# Patient Record
Sex: Male | Born: 1970
Health system: Southern US, Community
[De-identification: ages and names within clinical notes are randomized; demographics above are authoritative.]

## PROBLEM LIST (undated history)

## (undated) DIAGNOSIS — M109 Gout, unspecified: Secondary | ICD-10-CM

## (undated) DIAGNOSIS — I10 Essential (primary) hypertension: Secondary | ICD-10-CM

## (undated) HISTORY — PX: WISDOM TOOTH EXTRACTION: SHX21

## (undated) HISTORY — PX: OTHER SURGICAL HISTORY: SHX169

---

## 2015-02-24 ENCOUNTER — Encounter: Payer: Self-pay | Admitting: *Deleted

## 2015-02-24 ENCOUNTER — Ambulatory Visit
Admission: EM | Admit: 2015-02-24 | Discharge: 2015-02-24 | Disposition: A | Discharge status: Home / Self Care | Payer: BLUE CROSS/BLUE SHIELD | Attending: Emergency Medicine | Admitting: Emergency Medicine

## 2015-02-24 DIAGNOSIS — R109 Unspecified abdominal pain: Secondary | ICD-10-CM | POA: Diagnosis present

## 2015-02-24 DIAGNOSIS — N23 Unspecified renal colic: Secondary | ICD-10-CM | POA: Insufficient documentation

## 2015-02-24 DIAGNOSIS — R319 Hematuria, unspecified: Secondary | ICD-10-CM | POA: Diagnosis not present

## 2015-02-24 DIAGNOSIS — R944 Abnormal results of kidney function studies: Secondary | ICD-10-CM | POA: Diagnosis not present

## 2015-02-24 DIAGNOSIS — R7989 Other specified abnormal findings of blood chemistry: Secondary | ICD-10-CM

## 2015-02-24 HISTORY — DX: Essential (primary) hypertension: I10

## 2015-02-24 LAB — COMPREHENSIVE METABOLIC PANEL
ALT: 27 U/L (ref 17–63)
AST: 25 U/L (ref 15–41)
Albumin: 4.8 g/dL (ref 3.5–5.0)
Alkaline Phosphatase: 75 U/L (ref 38–126)
Anion gap: 10 (ref 5–15)
BUN: 15 mg/dL (ref 6–20)
CO2: 26 mmol/L (ref 22–32)
Calcium: 9.4 mg/dL (ref 8.9–10.3)
Chloride: 105 mmol/L (ref 101–111)
Creatinine, Ser: 1.47 mg/dL — ABNORMAL HIGH (ref 0.61–1.24)
GFR calc Af Amer: >60 mL/min (ref >60)
GFR calc non Af Amer: 57 mL/min — ABNORMAL LOW (ref >60)
Glucose, Bld: 125 mg/dL — ABNORMAL HIGH (ref 65–99)
Potassium: 3.9 mmol/L (ref 3.5–5.1)
Sodium: 141 mmol/L (ref 135–145)
Total Bilirubin: 0.7 mg/dL (ref 0.3–1.2)
Total Protein: 8.2 g/dL — ABNORMAL HIGH (ref 6.5–8.1)

## 2015-02-24 LAB — CBC WITH DIFFERENTIAL/PLATELET
Basophils Absolute: 0 10*3/uL (ref 0–0.1)
Basophils Relative: 0 %
Eosinophils Absolute: 0 10*3/uL (ref 0–0.7)
Eosinophils Relative: 0 %
HCT: 45.4 % (ref 40.0–52.0)
Hemoglobin: 15.2 g/dL (ref 13.0–18.0)
Lymphocytes Relative: 6 %
Lymphs Abs: 0.9 10*3/uL — ABNORMAL LOW (ref 1.0–3.6)
MCH: 29.2 pg (ref 26.0–34.0)
MCHC: 33.5 g/dL (ref 32.0–36.0)
MCV: 87.1 fL (ref 80.0–100.0)
Monocytes Absolute: 0.7 10*3/uL (ref 0.2–1.0)
Monocytes Relative: 4 %
Neutro Abs: 13.3 10*3/uL — ABNORMAL HIGH (ref 1.4–6.5)
Neutrophils Relative %: 90 %
Platelets: 270 10*3/uL (ref 150–440)
RBC: 5.21 MIL/uL (ref 4.40–5.90)
RDW: 12.5 % (ref 11.5–14.5)
WBC: 14.9 10*3/uL — ABNORMAL HIGH (ref 3.8–10.6)

## 2015-02-24 LAB — URINALYSIS COMPLETE WITH MICROSCOPIC (ARMC ONLY)
Bacteria, UA: NONE SEEN — AB
Glucose, UA: NEGATIVE mg/dL
Ketones, ur: NEGATIVE mg/dL
Leukocytes, UA: NEGATIVE
Nitrite: NEGATIVE
Protein, ur: 30 mg/dL — AB
Specific Gravity, Urine: 1.03 (ref 1.005–1.030)
Squamous Epithelial / LPF: NONE SEEN — AB
WBC, UA: NONE SEEN WBC/hpf (ref <3)
pH: 5 (ref 5.0–8.0)

## 2015-02-24 MED ORDER — IBUPROFEN 800 MG PO TABS: 800.0000 mg | ORAL_TABLET | Freq: Three times a day (TID) | ORAL | Status: DC

## 2015-02-24 MED ORDER — SODIUM CHLORIDE 0.9 % IV BOLUS (SEPSIS)
1000.0000 mL | Freq: Once | INTRAVENOUS | Status: AC
  Administered 2015-02-24: 1000 mL via INTRAVENOUS

## 2015-02-24 MED ORDER — ONDANSETRON HCL 4 MG PO TABS: 4.0000 mg | ORAL_TABLET | Freq: Four times a day (QID) | ORAL | Status: DC

## 2015-02-24 MED ORDER — ONDANSETRON 8 MG PO TBDP
8.0000 mg | ORAL_TABLET | Freq: Once | ORAL | Status: AC
  Administered 2015-02-24: 8 mg via ORAL

## 2015-02-24 MED ORDER — TAMSULOSIN HCL 0.4 MG PO CAPS: 0.4000 mg | ORAL_CAPSULE | Freq: Every day | ORAL | Status: DC

## 2015-02-24 MED ORDER — KETOROLAC TROMETHAMINE 60 MG/2ML IM SOLN
60.0000 mg | Freq: Once | INTRAMUSCULAR | Status: AC
  Administered 2015-02-24: 60 mg via INTRAMUSCULAR

## 2015-02-24 MED ORDER — HYDROCODONE-ACETAMINOPHEN 5-325 MG PO TABS: 2.0000 | ORAL_TABLET | ORAL | Status: DC | PRN

## 2015-02-24 NOTE — ED Provider Notes (Signed)
HPI  SUBJECTIVE:  Alan Barnes is a 44 y.o. male who presents with constant, waxing and waning right flank and  low abdominal pain that he describes as "twisting". Symptoms started at 0630 today. He reports nausea, vomiting, diaphoresis when pain becomes severe, cloudy, dark urine. States he cannot get comfortable. There is no migration. there is no radiation to the back. Symptoms are slightly better after vomiting, worse with lying down. It is not associated with movement, having a bowel movement, eating. He tried ibuprofen, Pepto-Bismol, Gas-X, Maalox. No fevers, abdominal distention, no urinary urgency, frequency, dysuria, hematuria, coughing, wheezing, chest pain, shortness of breath. No testicular pain, testicular swelling, penile discharge. No back pain. Patient states that he had a normal bowel movement this morning. Past medical history of hypertension. No history of diabetes, abdominal surgeries, gallbladder disease, nephrolithiasis. Family history negative for nephrolithiasis. Patient states he had similar symptoms before, had a CT with oral contrast to rule out appendicitis, cholelithiasis, but the workup was negative.    Past Medical History  Diagnosis Date  . Hypertension     History reviewed. No pertinent past surgical history.  No family history on file.  Social History  Substance Use Topics  . Smoking status: Never Smoker   . Smokeless tobacco: None  . Alcohol Use: No    No current facility-administered medications for this encounter.  Current outpatient prescriptions:  .  HYDROcodone-acetaminophen (NORCO/VICODIN) 5-325 MG per tablet, Take 2 tablets by mouth every 4 (four) hours as needed for moderate pain., Disp: 20 tablet, Rfl: 0 .  ibuprofen (ADVIL,MOTRIN) 800 MG tablet, Take 1 tablet (800 mg total) by mouth 3 (three) times daily., Disp: 30 tablet, Rfl: 0 .  ondansetron (ZOFRAN) 4 MG tablet, Take 1 tablet (4 mg total) by mouth every 6 (six) hours., Disp: 12 tablet,  Rfl: 0 .  tamsulosin (FLOMAX) 0.4 MG CAPS capsule, Take 1 capsule (0.4 mg total) by mouth at bedtime., Disp: 14 capsule, Rfl: 0  Allergies  Allergen Reactions  . Penicillins      ROS  As noted in HPI.   Physical Exam  BP 155/92 mmHg  Pulse 64  Temp(Src) 97.9 F (36.6 C) (Oral)  Ht 5\' 11"  (1.803 m)  Wt 350 lb (158.759 kg)  BMI 48.84 kg/m2  SpO2 98%  Constitutional: Well developed, well nourished, appears uncomfortable Eyes: PERRL, EOMI, conjunctiva normal bilaterally HENT: Normocephalic, atraumatic,mucus membranes moist Respiratory: Clear to auscultation bilaterally, no rales, no wheezing, no rhonchi Cardiovascular: Normal rate and rhythm, no murmurs, no gallops, no rubs GI: Normal appearance, right flank tenderness, negative Murphy negative McBurney no periumbilical, suprapubic tenderness Soft, hypoactive bowel sounds, no rebound, no guarding. Nondistended. GU: Deferred Back: no CVAT skin: No rash, skin intact Musculoskeletal: No edema, no tenderness, no deformities Neurologic: Alert & oriented x 3, CN II-XII grossly intact, no motor deficits, sensation grossly intact Psychiatric: Speech and behavior appropriate   ED Course   Medications  ketorolac (TORADOL) injection 60 mg (60 mg Intramuscular Given 02/24/15 1344)  ondansetron (ZOFRAN-ODT) disintegrating tablet 8 mg (8 mg Oral Given 02/24/15 1344)  sodium chloride 0.9 % bolus 1,000 mL (1,000 mLs Intravenous Given 02/24/15 1457)    Orders Placed This Encounter  Procedures  . Urine culture    Standing Status: Standing     Number of Occurrences: 1     Standing Expiration Date:     Order Specific Question:  Patient immune status    Answer:  Normal  . Urinalysis complete, with microscopic  Standing Status: Standing     Number of Occurrences: 1     Standing Expiration Date:   . Comprehensive metabolic panel    Standing Status: Standing     Number of Occurrences: 1     Standing Expiration Date:   . CBC with  Differential    Standing Status: Standing     Number of Occurrences: 1     Standing Expiration Date:   . Ambulatory referral to Urology    Referral Priority:  Routine    Referral Type:  Consultation    Referral Reason:  Specialty Services Required    Referred to Provider:  Sebastian Ache, MD    Requested Specialty:  Urology    Number of Visits Requested:  1  . Insert peripheral IV    Standing Status: Standing     Number of Occurrences: 1     Standing Expiration Date:    Results for orders placed or performed during the hospital encounter of 02/24/15 (from the past 24 hour(s))  Urinalysis complete, with microscopic     Status: Abnormal   Collection Time: 02/24/15 12:19 PM  Result Value Ref Range   Color, Urine YELLOW YELLOW   APPearance CLEAR CLEAR   Glucose, UA NEGATIVE NEGATIVE mg/dL   Bilirubin Urine 1+ (A) NEGATIVE   Ketones, ur NEGATIVE NEGATIVE mg/dL   Specific Gravity, Urine 1.030 1.005 - 1.030   Hgb urine dipstick 3+ (A) NEGATIVE   pH 5.0 5.0 - 8.0   Protein, ur 30 (A) NEGATIVE mg/dL   Nitrite NEGATIVE NEGATIVE   Leukocytes, UA NEGATIVE NEGATIVE   RBC / HPF TOO NUMEROUS TO COUNT <3 RBC/hpf   WBC, UA NONE SEEN <3 WBC/hpf   Bacteria, UA NONE SEEN (A) RARE   Squamous Epithelial / LPF NONE SEEN (A) RARE   Amorphous Crystal PRESENT   Comprehensive metabolic panel     Status: Abnormal   Collection Time: 02/24/15  1:48 PM  Result Value Ref Range   Sodium 141 135 - 145 mmol/L   Potassium 3.9 3.5 - 5.1 mmol/L   Chloride 105 101 - 111 mmol/L   CO2 26 22 - 32 mmol/L   Glucose, Bld 125 (H) 65 - 99 mg/dL   BUN 15 6 - 20 mg/dL   Creatinine, Ser 1.61 (H) 0.61 - 1.24 mg/dL   Calcium 9.4 8.9 - 09.6 mg/dL   Total Protein 8.2 (H) 6.5 - 8.1 g/dL   Albumin 4.8 3.5 - 5.0 g/dL   AST 25 15 - 41 U/L   ALT 27 17 - 63 U/L   Alkaline Phosphatase 75 38 - 126 U/L   Total Bilirubin 0.7 0.3 - 1.2 mg/dL   GFR calc non Af Amer 57 (L) >60 mL/min   GFR calc Af Amer >60 >60 mL/min   Anion  gap 10 5 - 15  CBC with Differential     Status: Abnormal   Collection Time: 02/24/15  1:48 PM  Result Value Ref Range   WBC 14.9 (H) 3.8 - 10.6 K/uL   RBC 5.21 4.40 - 5.90 MIL/uL   Hemoglobin 15.2 13.0 - 18.0 g/dL   HCT 04.5 40.9 - 81.1 %   MCV 87.1 80.0 - 100.0 fL   MCH 29.2 26.0 - 34.0 pg   MCHC 33.5 32.0 - 36.0 g/dL   RDW 91.4 78.2 - 95.6 %   Platelets 270 150 - 440 K/uL   Neutrophils Relative % 90 %   Neutro Abs 13.3 (H) 1.4 - 6.5  K/uL   Lymphocytes Relative 6 %   Lymphs Abs 0.9 (L) 1.0 - 3.6 K/uL   Monocytes Relative 4 %   Monocytes Absolute 0.7 0.2 - 1.0 K/uL   Eosinophils Relative 0 %   Eosinophils Absolute 0.0 0 - 0.7 K/uL   Basophils Relative 0 %   Basophils Absolute 0.0 0 - 0.1 K/uL   Results for orders placed or performed during the hospital encounter of 02/24/15  Urinalysis complete, with microscopic  Result Value Ref Range   Color, Urine YELLOW YELLOW   APPearance CLEAR CLEAR   Glucose, UA NEGATIVE NEGATIVE mg/dL   Bilirubin Urine 1+ (A) NEGATIVE   Ketones, ur NEGATIVE NEGATIVE mg/dL   Specific Gravity, Urine 1.030 1.005 - 1.030   Hgb urine dipstick 3+ (A) NEGATIVE   pH 5.0 5.0 - 8.0   Protein, ur 30 (A) NEGATIVE mg/dL   Nitrite NEGATIVE NEGATIVE   Leukocytes, UA NEGATIVE NEGATIVE   RBC / HPF TOO NUMEROUS TO COUNT <3 RBC/hpf   WBC, UA NONE SEEN <3 WBC/hpf   Bacteria, UA NONE SEEN (A) RARE   Squamous Epithelial / LPF NONE SEEN (A) RARE   Amorphous Crystal PRESENT   Comprehensive metabolic panel  Result Value Ref Range   Sodium 141 135 - 145 mmol/L   Potassium 3.9 3.5 - 5.1 mmol/L   Chloride 105 101 - 111 mmol/L   CO2 26 22 - 32 mmol/L   Glucose, Bld 125 (H) 65 - 99 mg/dL   BUN 15 6 - 20 mg/dL   Creatinine, Ser 1.61 (H) 0.61 - 1.24 mg/dL   Calcium 9.4 8.9 - 09.6 mg/dL   Total Protein 8.2 (H) 6.5 - 8.1 g/dL   Albumin 4.8 3.5 - 5.0 g/dL   AST 25 15 - 41 U/L   ALT 27 17 - 63 U/L   Alkaline Phosphatase 75 38 - 126 U/L   Total Bilirubin 0.7 0.3 - 1.2  mg/dL   GFR calc non Af Amer 57 (L) >60 mL/min   GFR calc Af Amer >60 >60 mL/min   Anion gap 10 5 - 15  CBC with Differential  Result Value Ref Range   WBC 14.9 (H) 3.8 - 10.6 K/uL   RBC 5.21 4.40 - 5.90 MIL/uL   Hemoglobin 15.2 13.0 - 18.0 g/dL   HCT 04.5 40.9 - 81.1 %   MCV 87.1 80.0 - 100.0 fL   MCH 29.2 26.0 - 34.0 pg   MCHC 33.5 32.0 - 36.0 g/dL   RDW 91.4 78.2 - 95.6 %   Platelets 270 150 - 440 K/uL   Neutrophils Relative % 90 %   Neutro Abs 13.3 (H) 1.4 - 6.5 K/uL   Lymphocytes Relative 6 %   Lymphs Abs 0.9 (L) 1.0 - 3.6 K/uL   Monocytes Relative 4 %   Monocytes Absolute 0.7 0.2 - 1.0 K/uL   Eosinophils Relative 0 %   Eosinophils Absolute 0.0 0 - 0.7 K/uL   Basophils Relative 0 %   Basophils Absolute 0.0 0 - 0.1 K/uL    No results found.  ED Clinical Impression  Renal colic on right side  Elevated serum creatinine  Hematuria   ED Assessment/Plan  1320-evaluated patient . giving Toradol. Patient declined Zofran. Checking UA, CBC, CMP.   UA with hematuria, some proteinuria no UTI.  Leukocytosis noted. Patient has elevated creatinine, but have none for comparison. Unsure if this is acute kidney injury or if this is chronic. BUN normal. Labs otherwise unremarkable. Presentation  most consistent with renal colic. No evidence of surgical abdomen. On reevaluation, patient states he feels significantly better after the Toradol. Giving IV fluid 1 L to rehydrate patient. Advised aggressive rehydration at home.  On reevaluation, patient states he feels significantly better after the Toradol. He is tolerating by mouth. Patient is to follow-up at the [facility] family clinic or return here in 5 days for repeat lab work to check renal function and UA. Discussed labs medical decision making, signs and symptoms that should prompt return to the emergency room. Patient agrees with plan   *This clinic note was created using Dragon dictation software. Therefore, there may be  occasional mistakes despite careful proofreading.  ?  Domenick Gong, MD 02/24/15 [phone]

## 2015-02-24 NOTE — Discharge Instructions (Signed)
Make sure you drink plenty of extra fluids. Follow-up with your primary care physician, or here in approximately 5 days to recheck your labs. Go to the ER for the signs and symptoms we discussed

## 2015-02-24 NOTE — ED Notes (Signed)
Pt states "right abdominal pain, woke up this morning with the discomfort, same pain happened once before several yrs ago, scan was done but nothing was found. More discomfort when sitting or laying."

## 2015-02-26 LAB — URINE CULTURE
Culture: 1000
Special Requests: NORMAL

## 2015-02-28 ENCOUNTER — Encounter: Payer: Self-pay | Admitting: Unknown Physician Specialty

## 2015-02-28 ENCOUNTER — Ambulatory Visit (INDEPENDENT_AMBULATORY_CARE_PROVIDER_SITE_OTHER): Payer: BLUE CROSS/BLUE SHIELD | Admitting: Unknown Physician Specialty

## 2015-02-28 VITALS — BP 159/104 | HR 81 | Temp 98.7°F | Ht 71.1 in | Wt 360.8 lb

## 2015-02-28 DIAGNOSIS — E669 Obesity, unspecified: Secondary | ICD-10-CM

## 2015-02-28 DIAGNOSIS — D72829 Elevated white blood cell count, unspecified: Secondary | ICD-10-CM | POA: Diagnosis not present

## 2015-02-28 DIAGNOSIS — I10 Essential (primary) hypertension: Secondary | ICD-10-CM | POA: Insufficient documentation

## 2015-02-28 DIAGNOSIS — N200 Calculus of kidney: Secondary | ICD-10-CM | POA: Diagnosis not present

## 2015-02-28 DIAGNOSIS — G4730 Sleep apnea, unspecified: Secondary | ICD-10-CM

## 2015-02-28 DIAGNOSIS — E668 Other obesity: Secondary | ICD-10-CM

## 2015-02-28 DIAGNOSIS — N289 Disorder of kidney and ureter, unspecified: Secondary | ICD-10-CM

## 2015-02-28 NOTE — Assessment & Plan Note (Addendum)
Probably sleep apnea.  Stridor in neck.  Snores at night.  Schedule sleep study

## 2015-02-28 NOTE — Patient Instructions (Signed)
DASH Eating Plan °DASH stands for "Dietary Approaches to Stop Hypertension." The DASH eating plan is a healthy eating plan that has been shown to reduce high blood pressure (hypertension). Additional health benefits may include reducing the risk of type 2 diabetes mellitus, heart disease, and stroke. The DASH eating plan may also help with weight loss. °WHAT DO I NEED TO KNOW ABOUT THE DASH EATING PLAN? °For the DASH eating plan, you will follow these general guidelines: °· Choose foods with a percent daily value for sodium of less than 5% (as listed on the food label). °· Use salt-free seasonings or herbs instead of table salt or sea salt. °· Check with your health care provider or pharmacist before using salt substitutes. °· Eat lower-sodium products, often labeled as "lower sodium" or "no salt added." °· Eat fresh foods. °· Eat more vegetables, fruits, and low-fat dairy products. °· Choose whole grains. Look for the word "whole" as the first word in the ingredient list. °· Choose fish and skinless chicken or turkey more often than red meat. Limit fish, poultry, and meat to 6 oz (170 g) each day. °· Limit sweets, desserts, sugars, and sugary drinks. °· Choose heart-healthy fats. °· Limit cheese to 1 oz (28 g) per day. °· Eat more home-cooked food and less restaurant, buffet, and fast food. °· Limit fried foods. °· Cook foods using methods other than frying. °· Limit canned vegetables. If you do use them, rinse them well to decrease the sodium. °· When eating at a restaurant, ask that your food be prepared with less salt, or no salt if possible. °WHAT FOODS CAN I EAT? °Seek help from a dietitian for individual calorie needs. °Grains °Whole grain or whole wheat bread. Brown rice. Whole grain or whole wheat pasta. Quinoa, bulgur, and whole grain cereals. Low-sodium cereals. Corn or whole wheat flour tortillas. Whole grain cornbread. Whole grain crackers. Low-sodium crackers. °Vegetables °Fresh or frozen vegetables  (raw, steamed, roasted, or grilled). Low-sodium or reduced-sodium tomato and vegetable juices. Low-sodium or reduced-sodium tomato sauce and paste. Low-sodium or reduced-sodium canned vegetables.  °Fruits °All fresh, canned (in natural juice), or frozen fruits. °Meat and Other Protein Products °Ground beef (85% or leaner), grass-fed beef, or beef trimmed of fat. Skinless chicken or turkey. Ground chicken or turkey. Pork trimmed of fat. All fish and seafood. Eggs. Dried beans, peas, or lentils. Unsalted nuts and seeds. Unsalted canned beans. °Dairy °Low-fat dairy products, such as skim or 1% milk, 2% or reduced-fat cheeses, low-fat ricotta or cottage cheese, or plain low-fat yogurt. Low-sodium or reduced-sodium cheeses. °Fats and Oils °Tub margarines without trans fats. Light or reduced-fat mayonnaise and salad dressings (reduced sodium). Avocado. Safflower, olive, or canola oils. Natural peanut or almond butter. °Other °Unsalted popcorn and pretzels. °The items listed above may not be a complete list of recommended foods or beverages. Contact your dietitian for more options. °WHAT FOODS ARE NOT RECOMMENDED? °Grains °White bread. White pasta. White rice. Refined cornbread. Bagels and croissants. Crackers that contain trans fat. °Vegetables °Creamed or fried vegetables. Vegetables in a cheese sauce. Regular canned vegetables. Regular canned tomato sauce and paste. Regular tomato and vegetable juices. °Fruits °Dried fruits. Canned fruit in light or heavy syrup. Fruit juice. °Meat and Other Protein Products °Fatty cuts of meat. Ribs, chicken wings, bacon, sausage, bologna, salami, chitterlings, fatback, hot dogs, bratwurst, and packaged luncheon meats. Salted nuts and seeds. Canned beans with salt. °Dairy °Whole or 2% milk, cream, half-and-half, and cream cheese. Whole-fat or sweetened yogurt. Full-fat   cheeses or blue cheese. Nondairy creamers and whipped toppings. Processed cheese, cheese spreads, or cheese  curds. °Condiments °Onion and garlic salt, seasoned salt, table salt, and sea salt. Canned and packaged gravies. Worcestershire sauce. Tartar sauce. Barbecue sauce. Teriyaki sauce. Soy sauce, including reduced sodium. Steak sauce. Fish sauce. Oyster sauce. Cocktail sauce. Horseradish. Ketchup and mustard. Meat flavorings and tenderizers. Bouillon cubes. Hot sauce. Tabasco sauce. Marinades. Taco seasonings. Relishes. °Fats and Oils °Butter, stick margarine, lard, shortening, ghee, and bacon fat. Coconut, palm kernel, or palm oils. Regular salad dressings. °Other °Pickles and olives. Salted popcorn and pretzels. °The items listed above may not be a complete list of foods and beverages to avoid. Contact your dietitian for more information. °WHERE CAN I FIND MORE INFORMATION? °National [employer], [employer], and [employer] Institute: www.nhlbi.nih.gov/health/health-topics/topics/dash/ °Document Released: 06/19/2011 Document Revised: 11/14/2013 Document Reviewed: 05/04/2013 °ExitCare® Patient Information ©2015 [employer]. This information is not intended to replace advice given to you by your health care provider. Make sure you discuss any questions you have with your health care provider. ° °

## 2015-02-28 NOTE — Assessment & Plan Note (Signed)
Reluctant to take another medication.  DASH diet for now

## 2015-02-28 NOTE — Progress Notes (Signed)
BP 159/104 mmHg  Pulse 81  Temp(Src) 98.7 F (37.1 C)  Ht 5' 11.1" (1.806 m)  Wt 360 lb 12.8 oz (163.658 kg)  BMI 50.18 kg/m2  SpO2 96%   Subjective:    Patient ID: Alan Barnes, male    DOB: 03/22/1971, 44 y.o.   MRN: [id number]  HPI: Alan Barnes is a 44 y.o. male  Chief Complaint  Patient presents with  . Hospitalization Follow-up    pt states he was seen at urgent care on 02/24/15 for kidney stones   Pt went to urgent care for abdominal pain and vomiing with hematuria.  Similar symptoms several years ago  The assumption was kidney stones.  He has felt fine since.  He is tired every day.  His white count was elevated at the time to 14.6 plus had an elevated creatnine which needed to be followed.    Hypertension Several years ago he was put on BP medications.  Walks through [place] to check it and is often 140/89-90.  Denies chest pain or SOB.  Usually never over 90  States Lisinopril given in the past caused gout to flare.  Also felt bad while he was taking it.  States he had no energy when he was taking it.  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  States he has never really tried to work on his diet.    Relevant past medical, surgical, family and social history reviewed and updated as indicated. Interim medical history since our last visit reviewed. Allergies and medications reviewed and updated.  Review of Systems  Constitutional: Negative.   HENT: Negative.   Eyes: Negative.   Respiratory: Negative.   Cardiovascular: Negative.   Gastrointestinal: Negative.   Endocrine: Negative.   Genitourinary: Negative.   Musculoskeletal: Negative.   Skin: Negative.   Allergic/Immunologic: Negative.   Neurological: Negative.   Hematological: Negative.   Psychiatric/Behavioral: Negative.   All other systems reviewed and are negative.   Per HPI unless specifically indicated above     Objective:    BP 159/104 mmHg  Pulse  81  Temp(Src) 98.7 F (37.1 C)  Ht 5' 11.1" (1.806 m)  Wt 360 lb 12.8 oz (163.658 kg)  BMI 50.18 kg/m2  SpO2 96%  Wt Readings from Last 3 Encounters:  02/28/15 360 lb 12.8 oz (163.658 kg)  11/30/13 355 lb (161.027 kg)  02/24/15 350 lb (158.759 kg)    Physical Exam  Constitutional: He is oriented to person, place, and time. He appears well-developed and well-nourished. No distress.  HENT:  Head: Normocephalic and atraumatic.  Eyes: Conjunctivae and lids are normal. Right eye exhibits no discharge. Left eye exhibits no discharge. No scleral icterus.  Cardiovascular: Normal rate, regular rhythm and normal heart sounds.   Pulmonary/Chest: Effort normal and breath sounds normal. Stridor present. No respiratory distress.  Abdominal: Normal appearance and bowel sounds are normal. He exhibits no distension. There is no splenomegaly or hepatomegaly. There is no tenderness.  Musculoskeletal: Normal range of motion.  Neurological: He is alert and oriented to person, place, and time.  Skin: Skin is warm, dry and intact. No rash noted. No pallor.  Psychiatric: He has a normal mood and affect. His behavior is normal. Judgment and thought content normal.  Vitals reviewed.   Results for orders placed or performed during the hospital encounter of 02/24/15  Urine culture  Result Value Ref Range   Specimen Description URINE, CLEAN CATCH  Special Requests Normal    Culture 1,000 COLONIES/mL INSIGNIFICANT GROWTH    Report Status 02/26/2015 FINAL   Urinalysis complete, with microscopic  Result Value Ref Range   Color, Urine YELLOW YELLOW   APPearance CLEAR CLEAR   Glucose, UA NEGATIVE NEGATIVE mg/dL   Bilirubin Urine 1+ (A) NEGATIVE   Ketones, ur NEGATIVE NEGATIVE mg/dL   Specific Gravity, Urine 1.030 1.005 - 1.030   Hgb urine dipstick 3+ (A) NEGATIVE   pH 5.0 5.0 - 8.0   Protein, ur 30 (A) NEGATIVE mg/dL   Nitrite NEGATIVE NEGATIVE   Leukocytes, UA NEGATIVE NEGATIVE   RBC / HPF TOO  NUMEROUS TO COUNT <3 RBC/hpf   WBC, UA NONE SEEN <3 WBC/hpf   Bacteria, UA NONE SEEN (A) RARE   Squamous Epithelial / LPF NONE SEEN (A) RARE   Amorphous Crystal PRESENT   Comprehensive metabolic panel  Result Value Ref Range   Sodium 141 135 - 145 mmol/L   Potassium 3.9 3.5 - 5.1 mmol/L   Chloride 105 101 - 111 mmol/L   CO2 26 22 - 32 mmol/L   Glucose, Bld 125 (H) 65 - 99 mg/dL   BUN 15 6 - 20 mg/dL   Creatinine, Ser 1.61 (H) 0.61 - 1.24 mg/dL   Calcium 9.4 8.9 - 09.6 mg/dL   Total Protein 8.2 (H) 6.5 - 8.1 g/dL   Albumin 4.8 3.5 - 5.0 g/dL   AST 25 15 - 41 U/L   ALT 27 17 - 63 U/L   Alkaline Phosphatase 75 38 - 126 U/L   Total Bilirubin 0.7 0.3 - 1.2 mg/dL   GFR calc non Af Amer 57 (L) >60 mL/min   GFR calc Af Amer >60 >60 mL/min   Anion gap 10 5 - 15  CBC with Differential  Result Value Ref Range   WBC 14.9 (H) 3.8 - 10.6 K/uL   RBC 5.21 4.40 - 5.90 MIL/uL   Hemoglobin 15.2 13.0 - 18.0 g/dL   HCT 04.5 40.9 - 81.1 %   MCV 87.1 80.0 - 100.0 fL   MCH 29.2 26.0 - 34.0 pg   MCHC 33.5 32.0 - 36.0 g/dL   RDW 91.4 78.2 - 95.6 %   Platelets 270 150 - 440 K/uL   Neutrophils Relative % 90 %   Neutro Abs 13.3 (H) 1.4 - 6.5 K/uL   Lymphocytes Relative 6 %   Lymphs Abs 0.9 (L) 1.0 - 3.6 K/uL   Monocytes Relative 4 %   Monocytes Absolute 0.7 0.2 - 1.0 K/uL   Eosinophils Relative 0 %   Eosinophils Absolute 0.0 0 - 0.7 K/uL   Basophils Relative 0 %   Basophils Absolute 0.0 0 - 0.1 K/uL      Assessment & Plan:   Problem List Items Addressed This Visit      Unprioritized   Hypertension - Primary    Reluctant to take another medication.  DASH diet for now      Relevant Orders   Microalbumin, Urine Waived   Uric acid   Comprehensive metabolic panel   Nephrolithiasis   Sleep apnea    Probably sleep apnea.  Stridor in neck.  Snores at night.  Schedule sleep study      Relevant Orders   Ambulatory referral to Sleep Studies   Extreme obesity   Relevant Orders    Ambulatory referral to Sleep Studies    Other Visit Diagnoses    Leukocytosis        Check CBC  Relevant Orders    CBC    Acute renal disease        Elevated Creatine in urgent care probably related to dehydration.  Recheck today    Relevant Orders    Uric acid    Comprehensive metabolic panel        Follow up plan: Return in about 6 weeks (around 04/11/2015).  For PE

## 2015-03-01 LAB — CBC
Hematocrit: 43.1 % (ref 37.5–51.0)
Hemoglobin: 14.5 g/dL (ref 12.6–17.7)
MCH: 29.9 pg (ref 26.6–33.0)
MCHC: 33.6 g/dL (ref 31.5–35.7)
MCV: 89 fL (ref 79–97)
Platelets: 265 10*3/uL (ref 150–379)
RBC: 4.85 x10E6/uL (ref 4.14–5.80)
RDW: 12.9 % (ref 12.3–15.4)
WBC: 9.2 10*3/uL (ref 3.4–10.8)

## 2015-03-01 LAB — COMPREHENSIVE METABOLIC PANEL
ALT: 27 IU/L (ref 0–44)
AST: 24 IU/L (ref 0–40)
Albumin/Globulin Ratio: 1.9 (ref 1.1–2.5)
Albumin: 4.5 g/dL (ref 3.5–5.5)
Alkaline Phosphatase: 76 IU/L (ref 39–117)
BUN/Creatinine Ratio: 12 (ref 9–20)
BUN: 14 mg/dL (ref 6–24)
Bilirubin Total: 0.5 mg/dL (ref 0.0–1.2)
CO2: 25 mmol/L (ref 18–29)
Calcium: 9.6 mg/dL (ref 8.7–10.2)
Chloride: 100 mmol/L (ref 97–108)
Creatinine, Ser: 1.21 mg/dL (ref 0.76–1.27)
GFR calc Af Amer: 85 mL/min/{1.73_m2} (ref >59)
GFR calc non Af Amer: 73 mL/min/{1.73_m2} (ref >59)
Globulin, Total: 2.4 g/dL (ref 1.5–4.5)
Glucose: 86 mg/dL (ref 65–99)
Potassium: 4.3 mmol/L (ref 3.5–5.2)
Sodium: 143 mmol/L (ref 134–144)
Total Protein: 6.9 g/dL (ref 6.0–8.5)

## 2015-03-01 LAB — MICROALBUMIN, URINE WAIVED
Creatinine, Urine Waived: 300 mg/dL (ref 10–300)
Microalb, Ur Waived: 30 mg/L — ABNORMAL HIGH (ref 0–19)
Microalb/Creat Ratio: <30 mg/g (ref <30)

## 2015-03-01 LAB — URIC ACID: Uric Acid: 9.1 mg/dL — ABNORMAL HIGH (ref 3.7–8.6)

## 2015-03-04 ENCOUNTER — Ambulatory Visit
Admission: EM | Admit: 2015-03-04 | Discharge: 2015-03-04 | Disposition: A | Discharge status: Home / Self Care | Payer: BLUE CROSS/BLUE SHIELD | Attending: Family Medicine | Admitting: Family Medicine

## 2015-03-04 ENCOUNTER — Ambulatory Visit: Payer: BLUE CROSS/BLUE SHIELD

## 2015-03-04 ENCOUNTER — Encounter: Payer: Self-pay | Admitting: Gynecology

## 2015-03-04 DIAGNOSIS — M25471 Effusion, right ankle: Secondary | ICD-10-CM | POA: Diagnosis not present

## 2015-03-04 DIAGNOSIS — I10 Essential (primary) hypertension: Secondary | ICD-10-CM | POA: Diagnosis not present

## 2015-03-04 DIAGNOSIS — M659 Synovitis and tenosynovitis, unspecified: Secondary | ICD-10-CM | POA: Diagnosis not present

## 2015-03-04 DIAGNOSIS — M25571 Pain in right ankle and joints of right foot: Secondary | ICD-10-CM | POA: Diagnosis present

## 2015-03-04 HISTORY — DX: Gout, unspecified: M10.9

## 2015-03-04 MED ORDER — NAPROXEN 500 MG PO TABS: 500.0000 mg | ORAL_TABLET | Freq: Two times a day (BID) | ORAL | Status: DC

## 2015-03-04 NOTE — ED Provider Notes (Signed)
CSN: [id number]     Arrival date & time 03/04/15  1018 History   First MD Initiated Contact with Patient 03/04/15 1034     Chief Complaint  Patient presents with  . Foot Pain   (Consider location/radiation/quality/duration/timing/severity/associated sxs/prior Treatment) HPI   This a 44 year old gentleman who presents with a four-day history of right ankle pain and swelling. 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 He does have a history of gout is usually involves his great toe and a usually turns red and swells and is very tender. This is different in that the pain is mostly in the ankle and despite full range of motion he is able to walk on a comfortably he wants kids up and moves around. His forefoot is very swollen but nontender. He has had no injury to his foot whatsoever. He recently was seen here for a kidney stone in past that spontaneously. If followed up with Dr. Yetta Numbers shortly afterwards. He has not had a flareup of gout in a long time.  Past Medical History  Diagnosis Date  . Hypertension   . Gout    Past Surgical History  Procedure Laterality Date  . None     Family History  Problem Relation Age of Onset  . Hypertension Mother   . Hypertension Father    Social History  Substance Use Topics  . Smoking status: Never Smoker   . Smokeless tobacco: Never Used  . Alcohol Use: No     Comment: denies    Review of Systems  Constitutional: Negative for fever and chills.  Musculoskeletal: Positive for joint swelling, arthralgias and gait problem.  All other systems reviewed and are negative.   Allergies  Penicillins  Home Medications   Prior to Admission medications   Medication Sig Start Date End Date Taking? Authorizing Provider  ibuprofen (ADVIL,MOTRIN) 800 MG tablet Take 1 tablet (800 mg total) by mouth 3 (three) times daily. 02/24/15  Yes Domenick Gong, MD   naproxen (NAPROSYN) 500 MG tablet Take 1 tablet (500 mg total) by mouth 2 (two) times daily with a meal. 03/04/15   Lutricia Feil, PA-C  tamsulosin (FLOMAX) 0.4 MG CAPS capsule Take 1 capsule (0.4 mg total) by mouth at bedtime. 02/24/15   Domenick Gong, MD   BP 156/105 mmHg  Pulse 82  Temp(Src) 98 F (36.7 C) (Oral)  Resp 18  Ht 5\' 11"  (1.803 m)  Wt 360 lb (163.295 kg)  BMI 50.23 kg/m2  SpO2 98% Physical Exam  Constitutional: He appears well-developed and well-nourished.  HENT:  Head: Normocephalic and atraumatic.  Eyes: Pupils are equal, round, and reactive to light.  Musculoskeletal:  Examination of the right foot shows it to be swollen nonpitting edema particularly of the forefoot. Ankle range of motion is very good as his subtalar motion. He has some tenderness around the ankle joint from the medial to the lateral malleolus and some ballotable fullness of the ankle joint. There is no erythema or ecchymosis.  Nursing note and vitals reviewed.   ED Course  Procedures (including critical care time) Labs Review Labs Reviewed - No data to display  Imaging Review Dg Ankle Complete Right  03/04/2015   CLINICAL DATA:  Pain and swelling for the last 3 days. No known injury  EXAM: RIGHT ANKLE - COMPLETE 3+ VIEW  COMPARISON:  None.  FINDINGS: There is regional soft tissue  swelling. There is a small amount of joint fluid. No evidence of fracture, dislocation, degenerative change or other focal finding.  IMPRESSION: Regional soft tissue swelling. Small joint effusion. No focal osseous or articular finding. Etiology unknown.   Electronically Signed   By: Paulina Fusi M.D.   On: 03/04/2015 12:28   12:53:10 Orders Completed CP  Apply cam walker (Boot Orthosis)                     MDM   1. Ankle effusion, right   2. Synovitis of right ankle    Discharge Medication List as of 03/04/2015 12:55 PM    START taking these medications   Details  naproxen (NAPROSYN) 500 MG  tablet Take 1 tablet (500 mg total) by mouth 2 (two) times daily with a meal., Starting 03/04/2015, Until Discontinued, Print      Plan: 1. Test/x-ray results and diagnosis reviewed with patient 2. rx as per orders; risks, benefits, potential side effects reviewed with patient 3. Recommend supportive treatment with crutches/wheelchair at work. Elevation/ice  4. F/u prn if symptoms worsen or don't improve with PCP     Lutricia Feil, PA-C 03/04/15 1258

## 2015-03-04 NOTE — ED Notes (Signed)
Patient c/o right foot pain / swelling x 4 days. Patient denies any injury to foot.

## 2015-04-17 ENCOUNTER — Encounter: Payer: Self-pay | Admitting: Unknown Physician Specialty

## 2015-04-17 ENCOUNTER — Ambulatory Visit (INDEPENDENT_AMBULATORY_CARE_PROVIDER_SITE_OTHER): Payer: BLUE CROSS/BLUE SHIELD | Admitting: Unknown Physician Specialty

## 2015-04-17 VITALS — BP 135/93 | HR 82 | Temp 99.0°F | Ht 71.0 in | Wt 354.8 lb

## 2015-04-17 DIAGNOSIS — Z0000 Encounter for general adult medical examination without abnormal findings: Secondary | ICD-10-CM | POA: Diagnosis not present

## 2015-04-17 DIAGNOSIS — Z23 Encounter for immunization: Secondary | ICD-10-CM | POA: Diagnosis not present

## 2015-04-17 DIAGNOSIS — G4730 Sleep apnea, unspecified: Secondary | ICD-10-CM

## 2015-04-17 NOTE — Progress Notes (Signed)
BP 135/93 mmHg  Pulse 82  Temp(Src) 99 F (37.2 C)  Ht  (1.803 m)  Wt 354 lb 12.8 oz (160.936 kg)  BMI 49.51 kg/m2  SpO2 96%   Subjective:    Patient ID: Alan Barnes, male    DOB: 02/13/1971, 44 y.o.   MRN: [id number]  HPI: Alan Barnes is a 44 y.o. male  Chief Complaint  Patient presents with  . Annual Exam   Obesity Pt is improving his diet and walking 40981 steps.  BP is improved.    Relevant past medical, surgical, family and social history reviewed and updated as indicated. Interim medical history since our last visit reviewed. Allergies and medications reviewed and updated.  Review of Systems  Constitutional: Negative.   HENT: Negative.   Eyes: Negative.   Respiratory: Negative.   Cardiovascular: Negative.   Gastrointestinal: Negative.   Endocrine: Negative.   Genitourinary: Negative.   Skin: Negative.   Allergic/Immunologic: Negative.   Neurological: Negative.   Hematological: Negative.   Psychiatric/Behavioral: Negative.     Per HPI unless specifically indicated above     Objective:    BP 135/93 mmHg  Pulse 82  Temp(Src) 99 F (37.2 C)  Ht  (1.803 m)  Wt 354 lb 12.8 oz (160.936 kg)  BMI 49.51 kg/m2  SpO2 96%  Wt Readings from Last 3 Encounters:  04/17/15 354 lb 12.8 oz (160.936 kg)  03/04/15 360 lb (163.295 kg)  02/28/15 360 lb 12.8 oz (163.658 kg)    Physical Exam  Constitutional: He is oriented to person, place, and time. He appears well-developed and well-nourished.  HENT:  Head: Normocephalic.  Eyes: Pupils are equal, round, and reactive to light.  Cardiovascular: Normal rate, regular rhythm and normal heart sounds.   Pulmonary/Chest: Effort normal.  Abdominal: Soft. Bowel sounds are normal.  Musculoskeletal: Normal range of motion.  Significant varicosities left leg  Neurological: He is alert and oriented to person, place, and time. He has normal reflexes.  Skin: Skin is warm and dry.  Psychiatric: He has a normal  mood and affect. His behavior is normal. Judgment and thought content normal.    Results for orders placed or performed in visit on 02/28/15  Microalbumin, Urine Waived  Result Value Ref Range   Microalb, Ur Waived 30 (H) 0 - 19 mg/L   Creatinine, Urine Waived 300 10 - 300 mg/dL   Microalb/Creat Ratio <30 <30 mg/g  Uric acid  Result Value Ref Range   Uric Acid 9.1 (H) 3.7 - 8.6 mg/dL  Comprehensive metabolic panel  Result Value Ref Range   Glucose 86 65 - 99 mg/dL   BUN 14 6 - 24 mg/dL   Creatinine, Ser 1.91 0.76 - 1.27 mg/dL   GFR calc non Af Amer 73 >59 mL/min/1.73   GFR calc Af Amer 85 >59 mL/min/1.73   BUN/Creatinine Ratio 12 9 - 20   Sodium 143 134 - 144 mmol/L   Potassium 4.3 3.5 - 5.2 mmol/L   Chloride 100 97 - 108 mmol/L   CO2 25 18 - 29 mmol/L   Calcium 9.6 8.7 - 10.2 mg/dL   Total Protein 6.9 6.0 - 8.5 g/dL   Albumin 4.5 3.5 - 5.5 g/dL   Globulin, Total 2.4 1.5 - 4.5 g/dL   Albumin/Globulin Ratio 1.9 1.1 - 2.5   Bilirubin Total 0.5 0.0 - 1.2 mg/dL   Alkaline Phosphatase 76 39 - 117 IU/L   AST 24 0 - 40 IU/L   ALT 27  0 - 44 IU/L  CBC  Result Value Ref Range   WBC 9.2 3.4 - 10.8 x10E3/uL   RBC 4.85 4.14 - 5.80 x10E6/uL   Hemoglobin 14.5 12.6 - 17.7 g/dL   Hematocrit 16.1 09.6 - 51.0 %   MCV 89 79 - 97 fL   MCH 29.9 26.6 - 33.0 pg   MCHC 33.6 31.5 - 35.7 g/dL   RDW 04.5 40.9 - 81.1 %   Platelets 265 150 - 379 x10E3/uL      Assessment & Plan:   Problem List Items Addressed This Visit      Unprioritized   Sleep apnea   Relevant Orders   Ambulatory referral to Sleep Studies   Extreme obesity (HCC)    Other Visit Diagnoses    Immunization due    -  Primary    Relevant Orders    Flu Vaccine QUAD 36+ mos PF IM (Fluarix & Fluzone Quad PF) (Completed)    Routine general medical examination at a health care facility        Relevant Orders    Lipid Panel w/o Chol/HDL Ratio    HIV antibody    PSA    TSH        Follow up plan: Return in about 6 months  (around 10/16/2015).

## 2015-04-18 ENCOUNTER — Encounter: Payer: Self-pay | Admitting: Unknown Physician Specialty

## 2015-04-18 LAB — LIPID PANEL W/O CHOL/HDL RATIO
Cholesterol, Total: 170 mg/dL (ref 100–199)
HDL: 31 mg/dL — ABNORMAL LOW (ref >39)
LDL Calculated: 100 mg/dL — ABNORMAL HIGH (ref 0–99)
Triglycerides: 194 mg/dL — ABNORMAL HIGH (ref 0–149)
VLDL Cholesterol Cal: 39 mg/dL (ref 5–40)

## 2015-04-18 LAB — PSA: Prostate Specific Ag, Serum: 1.3 ng/mL (ref 0.0–4.0)

## 2015-04-18 LAB — HIV ANTIBODY (ROUTINE TESTING W REFLEX): HIV Screen 4th Generation wRfx: NONREACTIVE

## 2015-04-18 LAB — TSH: TSH: 1.11 u[IU]/mL (ref 0.450–4.500)

## 2016-02-01 IMAGING — CR DG ANKLE COMPLETE 3+V*R*
3 series · 3 of 3 positions shown · non-contrast
Comparison: None.

CLINICAL DATA: Pain and swelling for the last 3 days. No known
injury

EXAM:
RIGHT ANKLE - COMPLETE 3+ VIEW

[ankle ap]
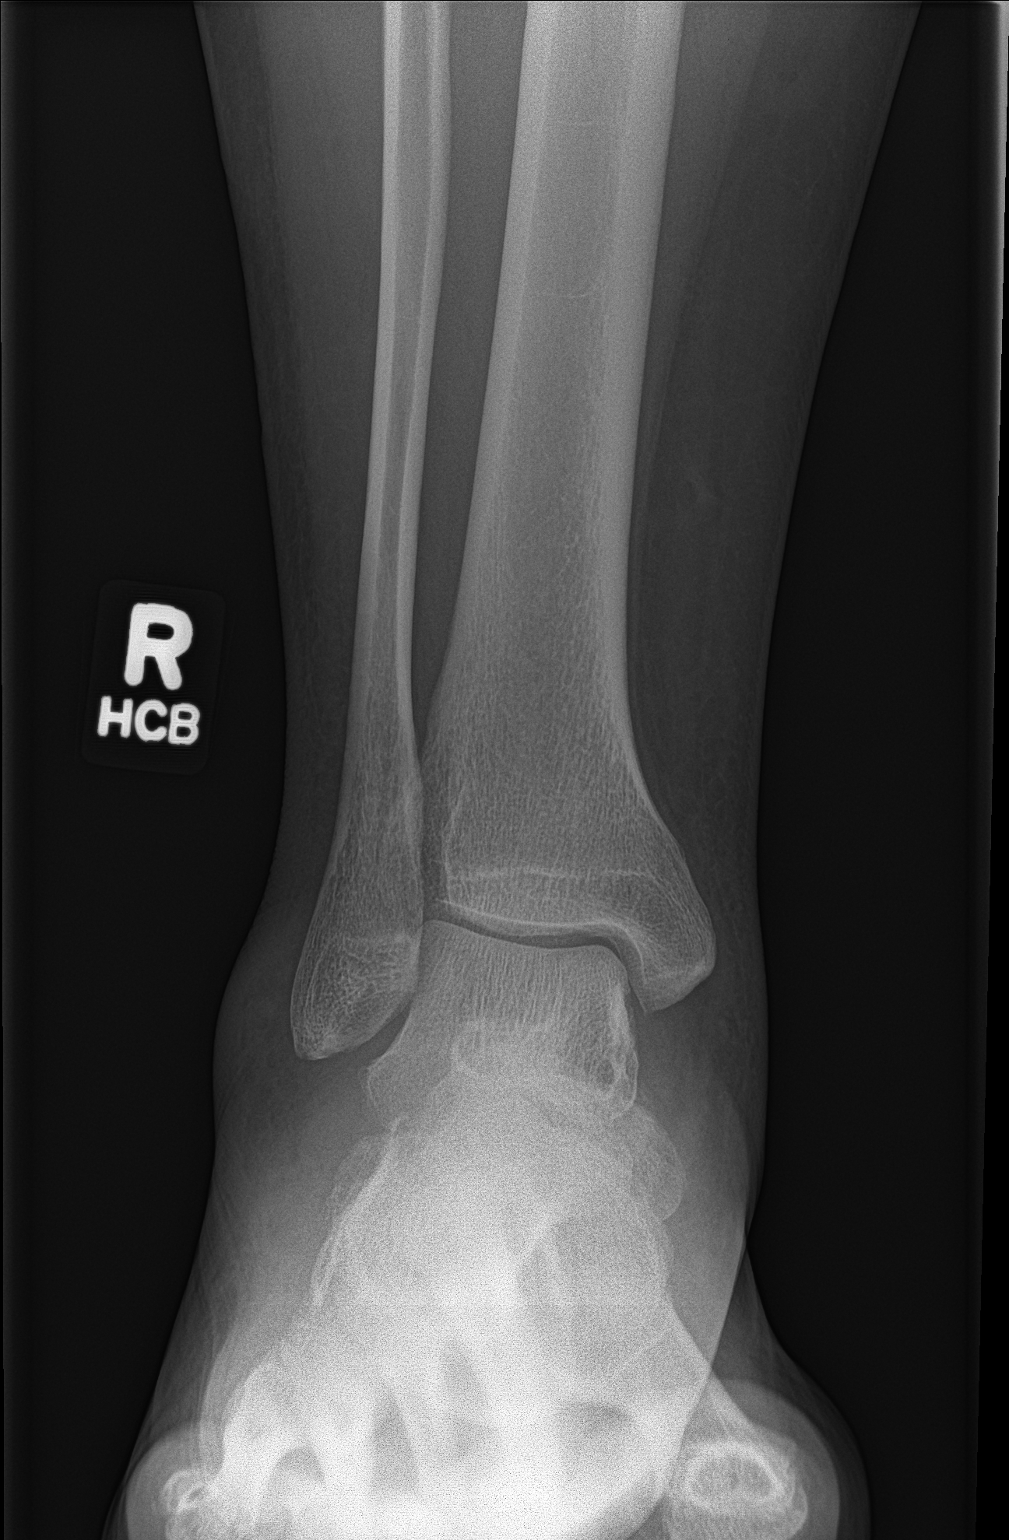

[ankle obl]
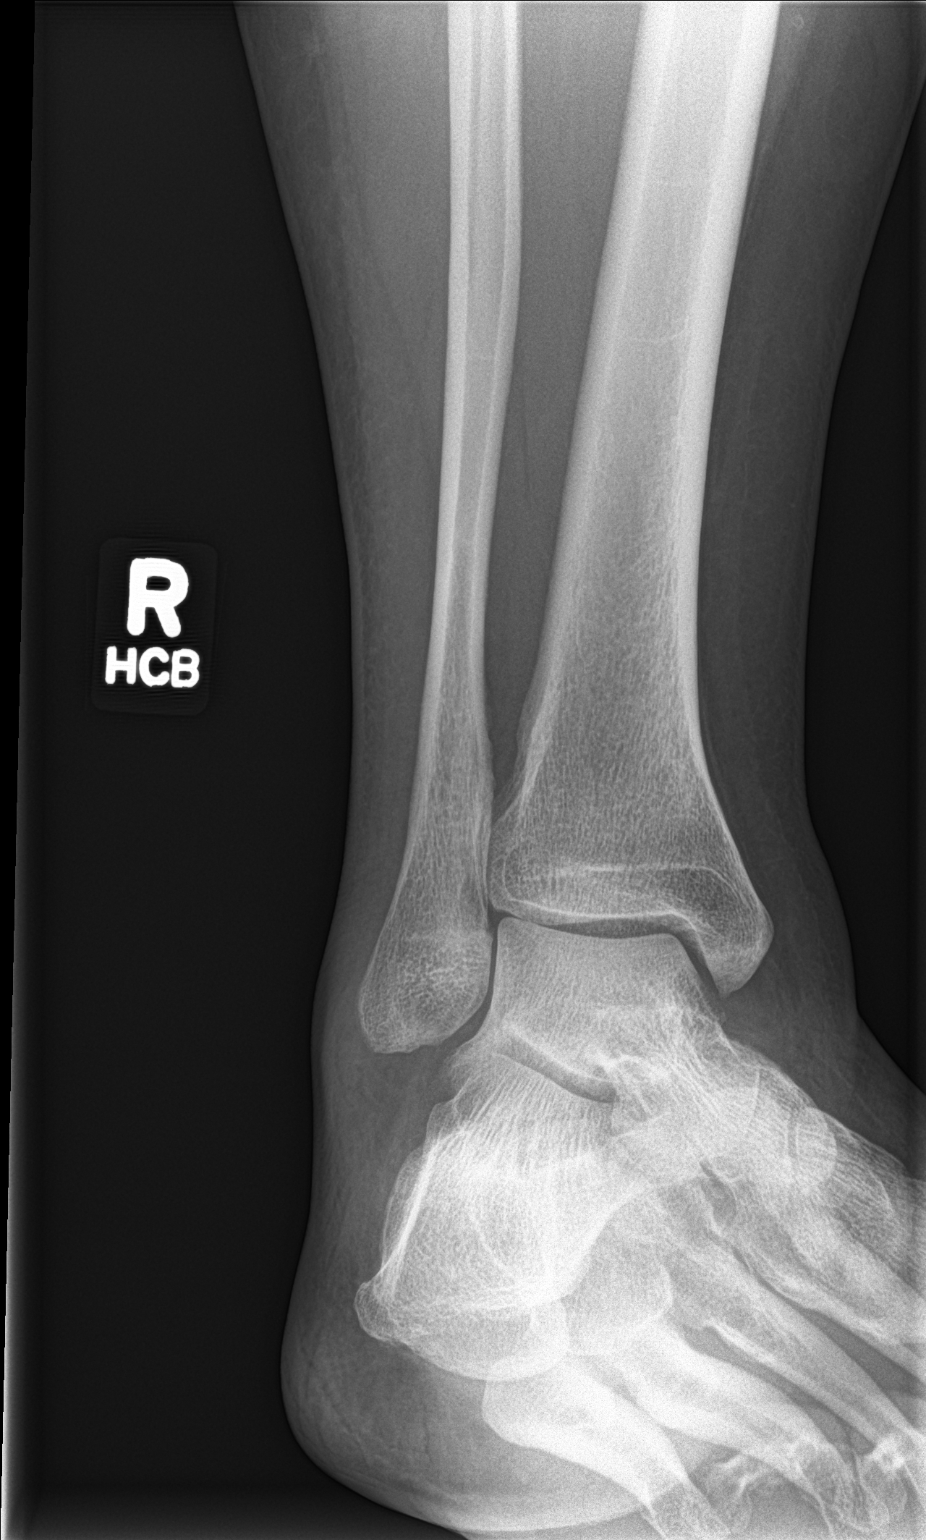

[ankle lat]
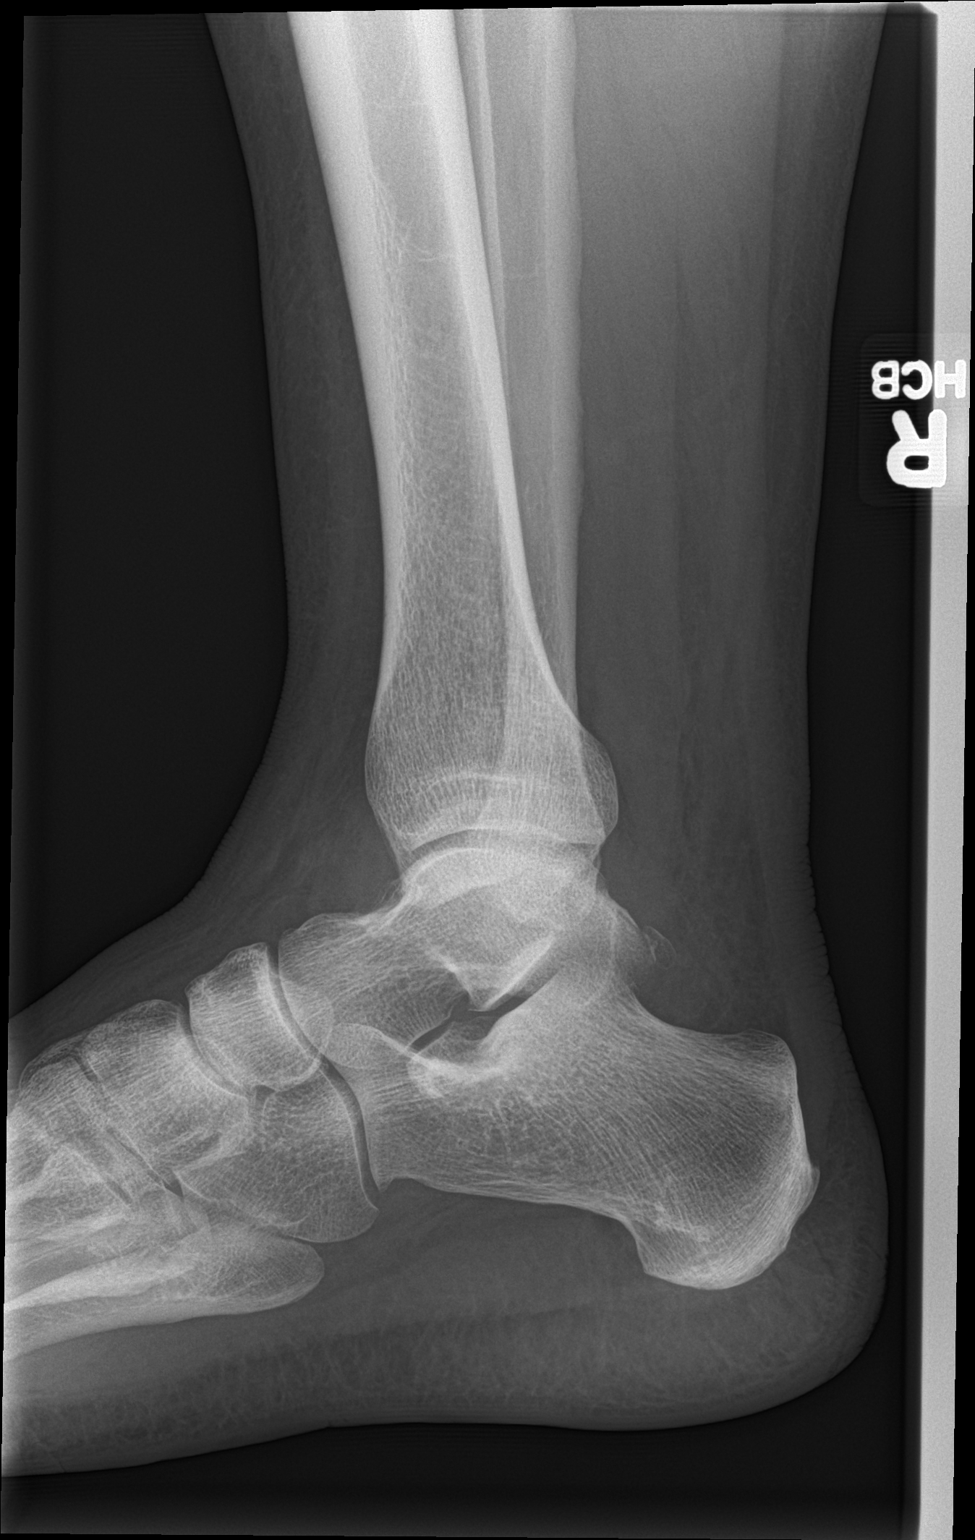

[3 of 3 positions shown; findings below may reference images not displayed]

FINDINGS: There is regional soft tissue swelling. There is a small amount of
joint fluid. No evidence of fracture, dislocation, degenerative
change or other focal finding.
IMPRESSION: Regional soft tissue swelling. Small joint effusion. No focal
osseous or articular finding. Etiology unknown.

## 2016-12-16 ENCOUNTER — Ambulatory Visit
Admission: EM | Admit: 2016-12-16 | Discharge: 2016-12-16 | Disposition: A | Discharge status: Home / Self Care | Payer: BLUE CROSS/BLUE SHIELD | Attending: Family Medicine | Admitting: Family Medicine

## 2016-12-16 DIAGNOSIS — M25462 Effusion, left knee: Secondary | ICD-10-CM | POA: Diagnosis not present

## 2016-12-16 DIAGNOSIS — M25562 Pain in left knee: Secondary | ICD-10-CM | POA: Diagnosis not present

## 2016-12-16 HISTORY — DX: Morbid (severe) obesity due to excess calories: E66.01

## 2016-12-16 LAB — FIBRIN DERIVATIVES D-DIMER (ARMC ONLY): Fibrin derivatives D-dimer (ARMC): 436.71 (ref 0.00–499.00)

## 2016-12-16 MED ORDER — HYDROCODONE-ACETAMINOPHEN 5-325 MG PO TABS: ORAL_TABLET | ORAL | 0 refills | Status: DC

## 2016-12-16 MED ORDER — ACETAMINOPHEN 500 MG PO TABS
1000.0000 mg | ORAL_TABLET | Freq: Once | ORAL | Status: AC
  Administered 2016-12-16: 1000 mg via ORAL

## 2016-12-16 MED ORDER — PREDNISONE 10 MG PO TABS
60.0000 mg | ORAL_TABLET | Freq: Once | ORAL | Status: AC
  Administered 2016-12-16: 60 mg via ORAL

## 2016-12-16 MED ORDER — PREDNISONE 20 MG PO TABS: ORAL_TABLET | ORAL | 0 refills | Status: DC

## 2016-12-16 NOTE — ED Provider Notes (Signed)
[facility]    CSN: [id number] Arrival date & time: 12/16/16  [id number]     History   Chief Complaint Chief Complaint  Patient presents with  . Knee Pain    HPI Alan Barnes is a 46 y.o. male.   46 yo male with a c/o left knee pain for 2 weeks intermittently with worsening over the last 3 days. Denies any trauma, falls, fevers, chills, tick bites, recent surgery, prolonged immobilization. States has a h/o gout.    The history is provided by the patient.    Past Medical History:  Diagnosis Date  . Gout   . Hypertension   . Morbid obesity [facility](HCC)     Patient Active Problem List   Diagnosis Date Noted  . Hypertension 02/28/2015  . Nephrolithiasis 02/28/2015  . Sleep apnea 02/28/2015  . Extreme obesity 02/28/2015    Past Surgical History:  Procedure Laterality Date  . None    . WISDOM TOOTH EXTRACTION         Home Medications    Prior to Admission medications   Medication Sig Start Date End Date Taking? Authorizing Provider  HYDROcodone-acetaminophen (NORCO/VICODIN) 5-325 MG tablet 1-2 tabs po bid prn severe pain 12/16/16   Payton Mccallumonty, Jalene Lacko, MD  ibuprofen (ADVIL,MOTRIN) 800 MG tablet Take 1 tablet (800 mg total) by mouth 3 (three) times daily. 02/24/15   Domenick GongMortenson, Ashley, MD  naproxen (NAPROSYN) 500 MG tablet Take 1 tablet (500 mg total) by mouth 2 (two) times daily with a meal. 03/04/15   Lutricia Feiloemer, William P, PA-C  predniSONE (DELTASONE) 20 MG tablet 3 tabs po once tomorrow, then 2 tabs po qd for 3 days, then 1 tab po qd for 4 days 12/16/16   Payton Mccallumonty, Jenasis Straley, MD    Family History Family History  Problem Relation Age of Onset  . Hypertension Mother   . Hypertension Father     Social History Social History  Substance Use Topics  . Smoking status: Never Smoker  . Smokeless tobacco: Never Used  . Alcohol use No     Comment: denies     Allergies   Penicillins   Review of Systems Review of Systems   Physical Exam Triage Vital Signs ED Triage  Vitals  Enc Vitals Group     BP 12/16/16 1027 (!) 180/127     Pulse Rate 12/16/16 1027 88     Resp 12/16/16 1027 18     Temp 12/16/16 1027 98.1 F (36.7 C)     Temp Source 12/16/16 1027 Oral     SpO2 12/16/16 1027 98 %     Weight 12/16/16 1028 (!) 355 lb (161 kg)     Height 12/16/16 1028 5\' 11"  (1.803 m)     Head Circumference --      Peak Flow --      Pain Score 12/16/16 1024 4     Pain Loc --      Pain Edu? --      Excl. in GC? --    No data found.   Updated Vital Signs BP (!) 168/102 (BP Location: Left Arm)   Pulse 88   Temp 98.1 F (36.7 C) (Oral)   Resp 18   Ht 5\' 11"  (1.803 m)   Wt (!) 355 lb (161 kg)   SpO2 98%   BMI 49.51 kg/m   Visual Acuity Right Eye Distance:   Left Eye Distance:   Bilateral Distance:    Right Eye Near:   Left Eye Near:  Bilateral Near:     Physical Exam  Constitutional: He appears well-developed and well-nourished. No distress.  Musculoskeletal:       Left knee: He exhibits swelling and effusion (mild). He exhibits normal range of motion, no ecchymosis, no deformity, no laceration, no erythema, normal alignment, no LCL laxity, normal patellar mobility, no bony tenderness, normal meniscus and no MCL laxity. Tenderness (generalized, but mostly posterior) found.  Skin: He is not diaphoretic.  Nursing note and vitals reviewed.     UC Treatments / Results  Labs (all labs ordered are listed, but only abnormal results are displayed) Labs Reviewed  FIBRIN DERIVATIVES D-DIMER ([facility] ONLY)    Well's Criteria for DVT= -1   EKG  EKG Interpretation None       Radiology No results found.  Procedures Procedures (including critical care time)  Medications Ordered in UC Medications  acetaminophen (TYLENOL) tablet 1,000 mg (1,000 mg Oral Given 12/16/16 1307)  predniSONE (DELTASONE) tablet 60 mg (60 mg Oral Given 12/16/16 1308)     Initial Impression / Assessment and Plan / UC Course  I have reviewed the triage vital signs and  the nursing notes.  Pertinent labs & imaging results that were available during my care of the patient were reviewed by me and considered in my medical decision making (see chart for details).       Final Clinical Impressions(s) / UC Diagnoses   Final diagnoses:  Left knee pain, unspecified chronicity  Effusion of left knee    New Prescriptions Discharge Medication List as of 12/16/2016  1:07 PM    START taking these medications   Details  HYDROcodone-acetaminophen (NORCO/VICODIN) 5-325 MG tablet 1-2 tabs po bid prn severe pain, Print    predniSONE (DELTASONE) 20 MG tablet 3 tabs po once tomorrow, then 2 tabs po qd for 3 days, then 1 tab po qd for 4 days, Normal       1. Lab results (D-dimer normal) and diagnosis reviewed with patient; given 1gm tylenol po and prednisone 60mg  po x 1 2. rx as per orders above; reviewed possible side effects, interactions, risks and benefits  3. Recommend supportive treatment with rest, ice, elevation, otc analgesics prn 4. Follow-up prn if symptoms worsen or don't improve   Payton Mccallum, MD 12/16/16 2313

## 2016-12-16 NOTE — ED Triage Notes (Signed)
Pt reports pain behind left knee 2 weeks ago which lasted 3 days and then resolved. He noticed a purple spot at that time. Mostly got better but now it started "twinging" again, worse with movement and try to straighten it. Feels weak when he tries to put weight on it. Pain 5/10. Today he has not noticed a purple spot or swelling. Has been using one crutch to help him support him x past few weeks. Describes the pain as a charlie horse.

## 2017-08-27 ENCOUNTER — Other Ambulatory Visit: Payer: Self-pay | Admitting: Family Medicine

## 2017-08-27 ENCOUNTER — Ambulatory Visit: Payer: BLUE CROSS/BLUE SHIELD | Admitting: Family Medicine

## 2017-08-27 ENCOUNTER — Encounter: Payer: Self-pay | Admitting: Family Medicine

## 2017-08-27 VITALS — BP 146/100 | HR 91 | Temp 100.1°F | Wt 367.2 lb

## 2017-08-27 DIAGNOSIS — J101 Influenza due to other identified influenza virus with other respiratory manifestations: Secondary | ICD-10-CM | POA: Diagnosis not present

## 2017-08-27 LAB — VERITOR FLU A/B WAIVED
Influenza A: POSITIVE — AB
Influenza B: NEGATIVE

## 2017-08-27 MED ORDER — BALOXAVIR MARBOXIL(80 MG DOSE) 2 X 40 MG PO TBPK: 80.0000 mg | ORAL_TABLET | Freq: Once | ORAL | 0 refills | Status: AC

## 2017-08-27 NOTE — Progress Notes (Signed)
   BP (!) 146/100 (BP Location: Left Arm, Patient Position: Sitting, Cuff Size: Large)   Pulse 91   Temp 100.1 F (37.8 C) (Oral)   Wt (!) 367 lb 4 oz (166.6 kg)   SpO2 98%   BMI 51.22 kg/m    Subjective:    Patient ID: Alan Barnes, male    DOB: May 28, 1971, 47 y.o.   MRN: [id number]  HPI: Alan Barnes is a 47 y.o. male  Chief Complaint  Patient presents with  . Nasal Congestion    fever, head pressure.    Headache, fevers, body aches, hoarseness x 2 days. Denies CP, SOB, sore throat, ear pain. Taking some OTC cold medications with some relief. Lots of sick contacts, works at a school. UTD on flu vaccine.   Relevant past medical, surgical, family and social history reviewed and updated as indicated. Interim medical history since our last visit reviewed. Allergies and medications reviewed and updated.  Review of Systems  Per HPI unless specifically indicated above     Objective:    BP (!) 146/100 (BP Location: Left Arm, Patient Position: Sitting, Cuff Size: Large)   Pulse 91   Temp 100.1 F (37.8 C) (Oral)   Wt (!) 367 lb 4 oz (166.6 kg)   SpO2 98%   BMI 51.22 kg/m   Wt Readings from Last 3 Encounters:  08/27/17 (!) 367 lb 4 oz (166.6 kg)  12/16/16 (!) 355 lb (161 kg)  04/17/15 (!) 354 lb 12.8 oz (160.9 kg)    Physical Exam  Constitutional: He is oriented to person, place, and time. He appears well-developed and well-nourished. No distress.  HENT:  Head: Atraumatic.  Right Ear: External ear normal.  Left Ear: External ear normal.  Nose: Nose normal.  Mouth/Throat: Oropharynx is clear and moist. No oropharyngeal exudate.  Eyes: Conjunctivae are normal. Pupils are equal, round, and reactive to light. No scleral icterus.  Neck: Normal range of motion. Neck supple.  Cardiovascular: Normal rate and normal heart sounds.  Pulmonary/Chest: Effort normal and breath sounds normal. No respiratory distress.  Musculoskeletal: Normal range of motion.  Lymphadenopathy:      He has no cervical adenopathy.  Neurological: He is alert and oriented to person, place, and time.  Skin: Skin is warm and dry.  Psychiatric: He has a normal mood and affect. His behavior is normal.  Nursing note and vitals reviewed.  Results for orders placed or performed during the hospital encounter of 12/16/16  Fibrin derivatives D-Dimer  Result Value Ref Range   Fibrin derivatives D-dimer ([facility]) 436.71 0.00 - 499.00      Assessment & Plan:   Problem List Items Addressed This Visit    None    Visit Diagnoses    Influenza A    -  Primary   Will tx with xofluza, OTC fever reducers, fluids, rest. Return precautions reviewed. F/u if no improvement   Relevant Medications   Baloxavir Marboxil 80 MG Dose (XOFLUZA) 40 (2) MG TBPK   Other Relevant Orders   Veritor Flu A/B Waived      Follow up plan: Return if symptoms worsen or fail to improve.

## 2017-08-27 NOTE — Patient Instructions (Signed)
Follow up as needed

## 2018-02-04 ENCOUNTER — Ambulatory Visit: Payer: Self-pay | Admitting: *Deleted

## 2018-02-04 NOTE — Telephone Encounter (Signed)
Agree with going to ER 

## 2018-02-04 NOTE — Telephone Encounter (Signed)
Pt called with having abd pain that started about 4 am and has not let up. He has taken ibuprofen and Tylenol and also used heat. Nothing has helped much. He did have this kind of pain once and thought he had kidney stones. He is voiding without difficulty and had a normal bowel movement yesterday. Abd is tender to touch. No fever. No bloating. Pt advised to go to the closest emergency department. Pt voiced understanding and [facility]. [facility].  Reason for Disposition . [1] SEVERE pain (e.g., excruciating) AND [2] present > 1 hour  Answer Assessment - Initial Assessment Questions 1. LOCATION: "Where does it hurt?"      Left side abd 2. RADIATION: "Does the pain shoot anywhere else?" (e.g., chest, back)     From the umbilicus to the left side 3. ONSET: "When did the pain begin?" (Minutes, hours or days ago)      4 am this morning 4. SUDDEN: "Gradual or sudden onset?"     sudden 5. PATTERN "Does the pain come and go, or is it constant?"    - If constant: "Is it getting better, staying the same, or worsening?"      (Note: Constant means the pain never goes away completely; most serious pain is constant and it progresses)     - If intermittent: "How long does it last?" "Do you have pain now?"     (Note: Intermittent means the pain goes away completely between bouts)     constant 6. SEVERITY: "How bad is the pain?"  (e.g., Scale 1-10; mild, moderate, or severe)    - MILD (1-3): doesn't interfere with normal activities, abdomen soft and not tender to touch     - MODERATE (4-7): interferes with normal activities or awakens from sleep, tender to touch     - SEVERE (8-10): excruciating pain, doubled over, unable to do any normal activities       Pain #9 7. RECURRENT SYMPTOM: "Have you ever had this type of abdominal pain before?" If so, ask: "When was the last time?" and "What happened that time?"      Had pain on the right side, provider assume it was  kidney stone, had medication to open up his tubes to were he could pass it. Did not have pain after that 8. CAUSE: "What do you think is causing the abdominal pain?"     dont know 9. RELIEVING/AGGRAVATING FACTORS: "What makes it better or worse?" (e.g., movement, antacids, bowel movement)     Heat, pain meds only for a very brief time 10. OTHER SYMPTOMS: "Has there been any vomiting, diarrhea, constipation, or urine problems?"       Vomited because of the pain  Protocols used: ABDOMINAL PAIN - MALE-A-AH

## 2019-06-30 ENCOUNTER — Telehealth: Payer: BLUE CROSS/BLUE SHIELD | Admitting: Physician Assistant

## 2019-06-30 DIAGNOSIS — J069 Acute upper respiratory infection, unspecified: Secondary | ICD-10-CM | POA: Diagnosis not present

## 2019-06-30 MED ORDER — BENZONATATE 100 MG PO CAPS: 100.0000 mg | ORAL_CAPSULE | Freq: Three times a day (TID) | ORAL | 0 refills | Status: DC | PRN

## 2019-06-30 MED ORDER — FLUTICASONE PROPIONATE 50 MCG/ACT NA SUSP: 2.0000 | Freq: Every day | NASAL | 6 refills | Status: DC

## 2019-06-30 NOTE — Progress Notes (Signed)
We are sorry you are not feeling well.  Here is how we plan to help!  Based on what you have shared with me, it looks like you may have a viral upper respiratory infection.  Upper respiratory infections are caused by a large number of viruses; however, rhinovirus is the most common cause.   Symptoms vary from person to person, with common symptoms including sore throat, cough, fatigue or lack of energy and feeling of general discomfort.  A low-grade fever of up to 100.4 may present, but is often uncommon.  Symptoms vary however, and are closely related to a person's age or underlying illnesses.  The most common symptoms associated with an upper respiratory infection are nasal discharge or congestion, cough, sneezing, headache and pressure in the ears and face.  These symptoms usually persist for about 3 to 10 days, but can last up to 2 weeks.  It is important to know that upper respiratory infections do not cause serious illness or complications in most cases.    Upper respiratory infections can be transmitted from person to person, with the most common method of transmission being a person's hands.  The virus is able to live on the skin and can infect other persons for up to 2 hours after direct contact.  Also, these can be transmitted when someone coughs or sneezes; thus, it is important to cover the mouth to reduce this risk.  To keep the spread of the illness at bay, good hand hygiene is very important.  This is an infection that is most likely caused by a virus. There are no specific treatments other than to help you with the symptoms until the infection runs its course.  We are sorry you are not feeling well.  Here is how we plan to help!   For nasal congestion, you may use an oral decongestants such as Mucinex D or if you have glaucoma or high blood pressure use plain Mucinex.  Saline nasal spray or nasal drops can help and can safely be used as often as needed for congestion.  For your congestion,  I have prescribed Fluticasone nasal spray one spray in each nostril twice a day  If you do not have a history of heart disease, hypertension, diabetes or thyroid disease, prostate/bladder issues or glaucoma, you may also use Sudafed to treat nasal congestion.  It is highly recommended that you consult with a pharmacist or your primary care physician to ensure this medication is safe for you to take.     If you have a cough, you may use cough suppressants such as Delsym and Robitussin.  If you have glaucoma or high blood pressure, you can also use Coricidin HBP.   For cough I have prescribed for you A prescription cough medication called Tessalon Perles 100 mg. You may take 1-2 capsules every 8 hours as needed for cough  If you have a sore or scratchy throat, use a saltwater gargle-  to  teaspoon of salt dissolved in a 4-ounce to 8-ounce glass of warm water.  Gargle the solution for approximately 15-30 seconds and then spit.  It is important not to swallow the solution.  You can also use throat lozenges/cough drops and Chloraseptic spray to help with throat pain or discomfort.  Warm or cold liquids can also be helpful in relieving throat pain.  For headache, pain or general discomfort, you can use Ibuprofen or Tylenol as directed.   Some authorities believe that zinc sprays or the use of   Echinacea may shorten the course of your symptoms.   HOME CARE . Only take medications as instructed by your medical team. . Be sure to drink plenty of fluids. Water is fine as well as fruit juices, sodas and electrolyte beverages. You may want to stay away from caffeine or alcohol. If you are nauseated, try taking small sips of liquids. How do you know if you are getting enough fluid? Your urine should be a pale yellow or almost colorless. . Get rest. . Taking a steamy shower or using a humidifier may help nasal congestion and ease sore throat pain. You can place a towel over your head and breathe in the steam  from hot water coming from a faucet. . Using a saline nasal spray works much the same way. . Cough drops, hard candies and sore throat lozenges may ease your cough. . Avoid close contacts especially the very young and the elderly . Cover your mouth if you cough or sneeze . Always remember to wash your hands.   GET HELP RIGHT AWAY IF: . You develop worsening fever. . If your symptoms do not improve within 10 days . You develop yellow or green discharge from your nose over 3 days. . You have coughing fits . You develop a severe head ache or visual changes. . You develop shortness of breath, difficulty breathing or start having chest pain . Your symptoms persist after you have completed your treatment plan  MAKE SURE YOU   Understand these instructions.  Will watch your condition.  Will get help right away if you are not doing well or get worse.  Your e-visit answers were reviewed by a board certified advanced clinical practitioner to complete your personal care plan. Depending upon the condition, your plan could have included both over the counter or prescription medications. Please review your pharmacy choice. If there is a problem, you may call our nursing hot line at and have the prescription routed to another pharmacy. Your safety is important to us. If you have drug allergies check your prescription carefully.   You can use MyChart to ask questions about today's visit, request a non-urgent call back, or ask for a work or school excuse for 24 hours related to this e-Visit. If it has been greater than 24 hours you will need to follow up with your provider, or enter a new e-Visit to address those concerns. You will get an e-mail in the next two days asking about your experience.  I hope that your e-visit has been valuable and will speed your recovery. Thank you for using e-visits.   Nahmir Zeidman PA-C  Approximately 5 minutes was spent documenting and reviewing patient's chart.       

## 2019-10-26 ENCOUNTER — Other Ambulatory Visit: Payer: Self-pay

## 2019-10-26 ENCOUNTER — Ambulatory Visit
Admission: EM | Admit: 2019-10-26 | Discharge: 2019-10-26 | Disposition: A | Discharge status: Home / Self Care | Payer: 59 | Attending: Family Medicine | Admitting: Family Medicine

## 2019-10-26 ENCOUNTER — Ambulatory Visit: Payer: Self-pay | Admitting: *Deleted

## 2019-10-26 DIAGNOSIS — I10 Essential (primary) hypertension: Secondary | ICD-10-CM | POA: Diagnosis not present

## 2019-10-26 MED ORDER — AMLODIPINE BESYLATE 5 MG PO TABS: 5.0000 mg | ORAL_TABLET | Freq: Every day | ORAL | 1 refills | Status: DC

## 2019-10-26 NOTE — Telephone Encounter (Signed)
Patient's wife is calling- he is not with her. States husband BP high- readings given. Advised per readings and symptoms- ED- she states he will not go.  Reason for Disposition . [1] Systolic BP  >= 160 OR Diastolic >= 100 AND [2] cardiac or neurologic symptoms (e.g., chest pain, difficulty breathing, unsteady gait, blurred vision)  Answer Assessment - Initial Assessment Questions 1. BLOOD PRESSURE: "What is the blood pressure?" "Did you take at least two measurements 5 minutes apart?"     170/109, 163/101,183/114 2. ONSET: "When did you take your blood pressure?"     15 min ago, within 30 minute time period  3. HOW: "How did you obtain the blood pressure?" (e.g., visiting nurse, automatic home BP monitor)     Automatic cuff 4. HISTORY: "Do you have a history of high blood pressure?"     yes 5. MEDICATIONS: "Are you taking any medications for blood pressure?" "Have you missed any doses recently?"     No BP medication 6. OTHER SYMPTOMS: "Do you have any symptoms?" (e.g., headache, chest pain, blurred vision, difficulty breathing, weakness)     Headache , nosebleed last night 7. PREGNANCY: "Is there any chance you are pregnant?" "When was your last menstrual period?"     n/a  Protocols used: HIGH BLOOD PRESSURE-A-AH

## 2019-10-26 NOTE — Telephone Encounter (Signed)
Called wife she took pt to [facility] and they started him on norvas 5mg  scheduled appt for Monday 4/19

## 2019-10-26 NOTE — ED Provider Notes (Signed)
[facility]    CSN: [id number] Arrival date & time: 10/26/19  1410  History   Chief Complaint Chief Complaint  Patient presents with  . Headache   HPI  49 year old male presents with headache and recent nosebleed.  Patient reports that he had a nosebleed last night.  Had a headache this morning.  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  He was thus advised to come in for evaluation.  He has previously been diagnosed with hypertension and is not on any medication currently.  He has not seen his primary care physician in quite some time.  He was previously on lisinopril but this was discontinued due to gout and also due to dizziness/lightheadedness.  Patient states that he feels well currently.  He has no symptoms at this time.  No other complaints or concerns.  Past Medical History:  Diagnosis Date  . Gout   . Hypertension   . Morbid obesity [facility])    Patient Active Problem List   Diagnosis Date Noted  . Hypertension 02/28/2015  . Nephrolithiasis 02/28/2015  . Sleep apnea 02/28/2015  . Extreme obesity 02/28/2015   Past Surgical History:  Procedure Laterality Date  . None    . WISDOM TOOTH EXTRACTION     Home Medications    Prior to Admission medications   Medication Sig Start Date End Date Taking? Authorizing Provider  amLODipine (NORVASC) 5 MG tablet Take 1 tablet (5 mg total) by mouth daily. 10/26/19   Coral Spikes, DO  fluticasone (FLONASE) 50 MCG/ACT nasal spray Place 2 sprays into both nostrils daily. 06/30/19 10/26/19  Terald Sleeper, PA-C    Family History Family History  Problem Relation Age of Onset  . Hypertension Mother   . Hypertension Father     Social History Social History   Tobacco Use  . Smoking status: Never Smoker  . Smokeless tobacco: Never Used  Substance Use Topics  . Alcohol use: No    Comment: denies  . Drug use: No      Allergies   Penicillins   Review of Systems Review of Systems  HENT:       Nosebleed.  Neurological: Positive for headaches.   Physical Exam Triage Vital Signs ED Triage Vitals  Enc Vitals Group     BP 10/26/19 1436 (!) 169/122     Pulse Rate 10/26/19 1436 85     Resp 10/26/19 1436 16     Temp 10/26/19 1436 98 F (36.7 C)     Temp Source 10/26/19 1436 Oral     SpO2 10/26/19 1436 99 %     Weight 10/26/19 1434 (!) 350 lb (158.8 kg)     Height 10/26/19 1434 5\' 11"  (1.803 m)     Head Circumference --      Peak Flow --      Pain Score 10/26/19 1434 0     Pain Loc --      Pain Edu? --      Excl. in [place]? --    Updated Vital Signs BP (!) 169/122 (BP Location: Left Arm)   Pulse 85   Temp 98 F (36.7 C) (Oral)   Resp 16   Ht 5\' 11"  (1.803 m)   Wt (!) 158.8 kg   SpO2 99%   BMI 48.82 kg/m   Visual Acuity Right Eye Distance:   Left Eye Distance:  Bilateral Distance:    Right Eye Near:   Left Eye Near:    Bilateral Near:     Physical Exam Vitals and nursing note reviewed.  Constitutional:      General: He is not in acute distress.    Appearance: Normal appearance. He is obese. He is not ill-appearing.  HENT:     Head: Normocephalic and atraumatic.  Eyes:     General:        Right eye: No discharge.        Left eye: No discharge.     Conjunctiva/sclera: Conjunctivae normal.  Cardiovascular:     Rate and Rhythm: Normal rate and regular rhythm.  Pulmonary:     Effort: Pulmonary effort is normal.     Breath sounds: Normal breath sounds. No wheezing, rhonchi or rales.  Neurological:     Mental Status: He is alert.  Psychiatric:        Mood and Affect: Mood normal.        Behavior: Behavior normal.    UC Treatments / Results  Labs (all labs ordered are listed, but only abnormal results are displayed) Labs Reviewed - No data to display  EKG   Radiology No results found.  Procedures Procedures (including critical care time)  Medications  Ordered in UC Medications - No data to display  Initial Impression / Assessment and Plan / UC Course  I have reviewed the triage vital signs and the nursing notes.  Pertinent labs & imaging results that were available during my care of the patient were reviewed by me and considered in my medical decision making (see chart for details).    49 year old male presents with hypertension.  Placed on Norvasc.  Advised primary care follow-up.  Final Clinical Impressions(s) / UC Diagnoses   Final diagnoses:  Essential hypertension   Discharge Instructions   None    ED Prescriptions    Medication Sig Dispense Auth. Provider   amLODipine (NORVASC) 5 MG tablet Take 1 tablet (5 mg total) by mouth daily. 90 tablet Everlene Other G, DO     PDMP not reviewed this encounter.   Tommie Sams, [place] 10/26/19 [phone]

## 2019-10-26 NOTE — Telephone Encounter (Signed)
Needs appt

## 2019-10-26 NOTE — ED Triage Notes (Signed)
Patient states that he had a nose bleed last night and woke up with a headache this morning. 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 Reports that he does not take any blood pressure medication. States that PCP (Dr. Olevia Perches) stated to come here and she will follow up with him in a day or so. States that he was on Lisinopril 8 years ago and it caused gout and he was unable to tolerate.

## 2019-10-31 ENCOUNTER — Encounter: Payer: Self-pay | Admitting: Family Medicine

## 2019-10-31 ENCOUNTER — Other Ambulatory Visit: Payer: Self-pay

## 2019-10-31 ENCOUNTER — Ambulatory Visit: Payer: 59 | Admitting: Family Medicine

## 2019-10-31 VITALS — BP 155/96 | HR 83 | Temp 99.0°F | Wt 370.0 lb

## 2019-10-31 DIAGNOSIS — M79672 Pain in left foot: Secondary | ICD-10-CM | POA: Diagnosis not present

## 2019-10-31 DIAGNOSIS — I10 Essential (primary) hypertension: Secondary | ICD-10-CM

## 2019-10-31 DIAGNOSIS — G4733 Obstructive sleep apnea (adult) (pediatric): Secondary | ICD-10-CM | POA: Diagnosis not present

## 2019-10-31 NOTE — Patient Instructions (Signed)
Increase amlodipine to 10 mg daily (2 of the 5 mg tabs)

## 2019-10-31 NOTE — Progress Notes (Signed)
BP (!) 155/96   Pulse 83   Temp 99 F (37.2 C) (Oral)   Wt (!) 370 lb (167.8 kg)   SpO2 97%   BMI 51.60 kg/m    Subjective:    Patient ID: Alan Barnes, male    DOB: [birth year], 49 y.o.   MRN: [id number]  HPI: Alan Barnes is a 49 y.o. male  Chief Complaint  Patient presents with  . Hypertension  . Foot Pain    left heel x 3 days now   Here today for HTN.   Has had borderline high blood pressures for years, not on medications for the past few years. Recalls being on HCTZ once but this caused gout issues. The past few days has been having posterior headaches, nosebleeds. Went to UC and was placed on amlodipine 5 mg. BPs there were around 170/100s but some readings he's been getting have been as high as 200/110s range. So far tolerating well without side effects. Started walking about a month ago in the mornings, has lost about 10 lb so far. Diet needs to improve per patient.   OSA - had a sleep study many years ago that he states never went anywhere, never got a CPAP machine. Considered repeat study several years ago but insurance was not willing to cover it at that time.   Left heel pain the past 3 days. Hurts with weight bearing and when pressure is lifted. Taking tylenol with some mild relief. Denies swelling, redness, numbness, tingling.   Relevant past medical, surgical, family and social history reviewed and updated as indicated. Interim medical history since our last visit reviewed. Allergies and medications reviewed and updated.  Review of Systems  Per HPI unless specifically indicated above     Objective:    BP (!) 155/96   Pulse 83   Temp 99 F (37.2 C) (Oral)   Wt (!) 370 lb (167.8 kg)   SpO2 97%   BMI 51.60 kg/m   Wt Readings from Last 3 Encounters:  10/31/19 (!) 370 lb (167.8 kg)  10/26/19 (!) 350 lb (158.8 kg)  08/27/17 (!) 367 lb 4 oz (166.6 kg)    Physical Exam Vitals and nursing note reviewed.  Constitutional:      Appearance: Normal  appearance. He is obese.  HENT:     Head: Atraumatic.  Eyes:     Extraocular Movements: Extraocular movements intact.     Conjunctiva/sclera: Conjunctivae normal.  Cardiovascular:     Rate and Rhythm: Normal rate and regular rhythm.  Pulmonary:     Effort: Pulmonary effort is normal.     Breath sounds: Normal breath sounds.  Musculoskeletal:        General: Tenderness (ttp left achilles) present. No swelling or deformity. Normal range of motion.     Cervical back: Normal range of motion and neck supple.  Skin:    General: Skin is warm and dry.     Findings: No erythema.  Neurological:     General: No focal deficit present.     Mental Status: He is oriented to person, place, and time.  Psychiatric:        Mood and Affect: Mood normal.        Thought Content: Thought content normal.        Judgment: Judgment normal.     Results for orders placed or performed in visit on 08/27/17  Veritor Flu A/B Waived  Result Value Ref Range   Influenza A Positive (A) Negative  Influenza B Negative Negative      Assessment & Plan:   Problem List Items Addressed This Visit      Cardiovascular and Mediastinum   Hypertension - Primary    Not at goal, increase amlodipine to 10 mg and recheck at upcoming f/u. Discussed at length diet and exercise changes, stress reduction, improved sleep quality        Respiratory   Sleep apnea    Unsure if he wants to return for new sleep study, pt will consider       Other Visit Diagnoses    Pain of left heel       Stretches, epsom salt soaks, voltaren gel prn. F/u if worsening or not improving       Follow up plan: Return in about 2 weeks (around 11/14/2019) for BP, heel pain f/u.

## 2019-11-01 NOTE — Assessment & Plan Note (Signed)
Unsure if he wants to return for new sleep study, pt will consider

## 2019-11-01 NOTE — Assessment & Plan Note (Signed)
Not at goal, increase amlodipine to 10 mg and recheck at upcoming f/u. Discussed at length diet and exercise changes, stress reduction, improved sleep quality

## 2019-11-14 ENCOUNTER — Ambulatory Visit: Payer: 59 | Admitting: Family Medicine

## 2019-11-14 ENCOUNTER — Encounter: Payer: Self-pay | Admitting: Family Medicine

## 2019-11-14 ENCOUNTER — Other Ambulatory Visit: Payer: Self-pay

## 2019-11-14 VITALS — BP 150/110 | HR 85 | Temp 99.1°F | Wt 371.1 lb

## 2019-11-14 DIAGNOSIS — I10 Essential (primary) hypertension: Secondary | ICD-10-CM

## 2019-11-14 MED ORDER — AMLODIPINE BESYLATE 10 MG PO TABS: 10.0000 mg | ORAL_TABLET | Freq: Every day | ORAL | 1 refills | Status: DC

## 2019-11-14 NOTE — Assessment & Plan Note (Signed)
Under fairly good control per home readings, high today but patient had an incredibly stressful afternoon and feels this was causative of today's reading. Opting to continue current regimen and working on lifestyle modifications for improved control. Will call with persistent abnormal readings and f/u in 6 months

## 2019-11-14 NOTE — Progress Notes (Signed)
   BP (!) 150/110   Pulse 85   Temp 99.1 F (37.3 C)   Wt (!) 371 lb 2 oz (168.3 kg)   SpO2 98%   BMI 51.76 kg/m    Subjective:    Patient ID: Alan Barnes, male    DOB: [birth year], 49 y.o.   MRN: [id number]  HPI: Alan Barnes is a 49 y.o. male  Chief Complaint  Patient presents with  . Hypertension  . Foot Pain    Heel   Presenting today following up on HTN, currently taking 10 mg amlodipine daily and tolerating very well. States he feels better than he's felt in several years. Went from 5 sodas daily down to 1 and has been exercising most days out of the week lately. Denies Cp, SOB, HAs, dizziness. Home readings have been ranging between 120s/70s - 140s/80s range.   Relevant past medical, surgical, family and social history reviewed and updated as indicated. Interim medical history since our last visit reviewed. Allergies and medications reviewed and updated.  Review of Systems  Per HPI unless specifically indicated above     Objective:    BP (!) 150/110   Pulse 85   Temp 99.1 F (37.3 C)   Wt (!) 371 lb 2 oz (168.3 kg)   SpO2 98%   BMI 51.76 kg/m   Wt Readings from Last 3 Encounters:  11/14/19 (!) 371 lb 2 oz (168.3 kg)  10/31/19 (!) 370 lb (167.8 kg)  10/26/19 (!) 350 lb (158.8 kg)    Physical Exam Vitals and nursing note reviewed.  Constitutional:      Appearance: Normal appearance.  HENT:     Head: Atraumatic.  Eyes:     Extraocular Movements: Extraocular movements intact.     Conjunctiva/sclera: Conjunctivae normal.  Cardiovascular:     Rate and Rhythm: Normal rate and regular rhythm.  Pulmonary:     Effort: Pulmonary effort is normal.     Breath sounds: Normal breath sounds.  Musculoskeletal:        General: Normal range of motion.     Cervical back: Normal range of motion and neck supple.  Skin:    General: Skin is warm and dry.  Neurological:     General: No focal deficit present.     Mental Status: He is oriented to person, place, and  time.  Psychiatric:        Mood and Affect: Mood normal.        Thought Content: Thought content normal.        Judgment: Judgment normal.     Results for orders placed or performed in visit on 08/27/17  Veritor Flu A/B Waived  Result Value Ref Range   Influenza A Positive (A) Negative   Influenza B Negative Negative      Assessment & Plan:   Problem List Items Addressed This Visit      Cardiovascular and Mediastinum   Hypertension - Primary    Under fairly good control per home readings, high today but patient had an incredibly stressful afternoon and feels this was causative of today's reading. Opting to continue current regimen and working on lifestyle modifications for improved control. Will call with persistent abnormal readings and f/u in 6 months      Relevant Medications   amLODipine (NORVASC) 10 MG tablet       Follow up plan: Return in about 6 months (around 05/16/2020) for CPE.

## 2020-05-12 ENCOUNTER — Encounter: Payer: Self-pay | Admitting: Nurse Practitioner

## 2020-05-12 DIAGNOSIS — M109 Gout, unspecified: Secondary | ICD-10-CM | POA: Insufficient documentation

## 2020-05-16 ENCOUNTER — Ambulatory Visit: Payer: 59 | Admitting: Nurse Practitioner

## 2020-05-16 ENCOUNTER — Ambulatory Visit: Payer: 59 | Admitting: Family Medicine

## 2020-07-19 ENCOUNTER — Other Ambulatory Visit: Payer: Self-pay

## 2020-07-19 ENCOUNTER — Ambulatory Visit (INDEPENDENT_AMBULATORY_CARE_PROVIDER_SITE_OTHER): Payer: 59 | Admitting: Family Medicine

## 2020-07-19 ENCOUNTER — Encounter: Payer: Self-pay | Admitting: Family Medicine

## 2020-07-19 VITALS — BP 148/102 | HR 87 | Temp 98.1°F | Ht 70.1 in | Wt 363.4 lb

## 2020-07-19 DIAGNOSIS — E668 Other obesity: Secondary | ICD-10-CM

## 2020-07-19 DIAGNOSIS — I10 Essential (primary) hypertension: Secondary | ICD-10-CM | POA: Diagnosis not present

## 2020-07-19 DIAGNOSIS — G4733 Obstructive sleep apnea (adult) (pediatric): Secondary | ICD-10-CM

## 2020-07-19 DIAGNOSIS — M1000 Idiopathic gout, unspecified site: Secondary | ICD-10-CM

## 2020-07-19 LAB — MICROALBUMIN, URINE WAIVED
Creatinine, Urine Waived: 200 mg/dL (ref 10–300)
Microalb, Ur Waived: 30 mg/L — ABNORMAL HIGH (ref 0–19)
Microalb/Creat Ratio: <30 mg/g (ref <30)

## 2020-07-19 LAB — BAYER DCA HB A1C WAIVED: HB A1C (BAYER DCA - WAIVED): 5.4 % (ref <7.0)

## 2020-07-19 MED ORDER — LOSARTAN POTASSIUM 25 MG PO TABS: 25.0000 mg | ORAL_TABLET | Freq: Every day | ORAL | 0 refills | Status: DC

## 2020-07-19 MED ORDER — AMLODIPINE BESYLATE 10 MG PO TABS: 10.0000 mg | ORAL_TABLET | Freq: Every day | ORAL | 1 refills | Status: DC

## 2020-07-19 NOTE — Patient Instructions (Addendum)
It was great to see you!  Our plans for today:  - We are starting a new medicine for your blood pressure.  - Try to lose weight by increasing your activity level, eating more fruits and vegetables and watching your portion sizes. See below for tips.  - We are ordering a sleep study. - I recommend getting your COVID vaccine to best protect yourself against COVID. - Come back in 1 month for blood pressure follow up.  We are checking some labs today, we will release these results to your MyChart.  Take care and seek immediate care sooner if you develop any concerns.   Dr. Ky Barban  Here is an example of what a healthy plate looks like:    ? Make half your plate fruits and vegetables.     ? Focus on whole fruits.     ? Vary your veggies.  ? Make half your grains whole grains. -     ? Look for the word "whole" at the beginning of the ingredients list    ? Some whole-grain ingredients include whole oats, whole-wheat flour,        whole-grain corn, whole-grain brown rice, and whole rye.  ? Move to low-fat and fat-free milk or yogurt.  ? Vary your protein routine. - Meat, fish, poultry (chicken, [place]), eggs, beans (kidney, pinto), dairy.  ? Drink and eat less sodium, saturated fat, and added sugars.  Look for opportunities to move your body throughout your day:  Never lie down when you can sit; never sit when you can stand; never stand when you can pace.  Moving your body throughout the day is just as important as the 30 or 60 minutes of exercise at the gym!  Get social Get active with your friends instead of going out to eat. Go for a hike, walk around the mall, or play an exercise-themed video game.   Move more at work Fit more activity into the workday. Stand during phone calls, use a printer farther from your desk, and get up to stretch each hour.    Do something new Develop a new skill to kick-start your motivation. Sign up for a class to learn how to [employer], surf, do  tai chi, or play a sport.    Keep cool in the pool Don't like to sweat? Hit the local community pool for a swim, water polo, or water aerobics class to stay cool while exercising.    Stay on track Use a fitness tracker (FITBIT, Fitness Pal mobile app) to track your activity and provide motivation to reach your goals.

## 2020-07-19 NOTE — Assessment & Plan Note (Signed)
Doing well without flares after changing diet.

## 2020-07-19 NOTE — Assessment & Plan Note (Signed)
Recommended weight loss through lifestyle changes such as diet and exercise. Handout provided. Will obtain screening labs today.

## 2020-07-19 NOTE — Assessment & Plan Note (Signed)
Has not had successful sleep study yet, will order today. Continue breathe right strips.

## 2020-07-19 NOTE — Progress Notes (Signed)
BP (!) 148/102 (BP Location: Left Arm, Cuff Size: Large)   Pulse 87   Temp 98.1 F (36.7 C) (Oral)   Ht 5' 10.1" (1.781 m)   Wt (!) 363 lb 6.4 oz (164.8 kg)   SpO2 97%   BMI 51.99 kg/m    Subjective:    Patient ID: Alan Barnes, male    DOB: 12/27/1970, 50 y.o.   MRN: [id number]  HPI: Alan Barnes is a 50 y.o. male presenting on 07/19/2020 for comprehensive medical examination. Current medical complaints include:none   Hypertension: - Medications: amlodipine 10mg  - previously on other but d/ced due to gout? - Compliance: good - Checking BP at home: yes, 130-140s SBP - Denies any SOB, CP, vision changes, LE edema, medication SEs, or symptoms of hypotension - Diet: eats 2 meals per day - Exercise: walks about 2-4 miles per day at work. - did sleep study previously but didn't work ok.  Gout - doing well for several years. Avoids red meat. No alcohol.   He currently lives with: wife, daughter Interim Problems from his last visit: no  Depression Screen done today and results listed below:  Depression screen [facility] 2/9 07/19/2020 10/31/2019  Decreased Interest 0 0  Down, Depressed, Hopeless 0 0  PHQ - 2 Score 0 0  Altered sleeping - 0  Tired, decreased energy - 0  Change in appetite - 0  Feeling bad or failure about yourself  - 0  Trouble concentrating - 0  Moving slowly or fidgety/restless - 0  Suicidal thoughts - 0  PHQ-9 Score - 0    The patient does not have a history of falls. I did not complete a risk assessment for falls. A plan of care for falls was not documented.   Past Medical History:  Past Medical History:  Diagnosis Date  . Gout   . Hypertension   . Morbid obesity [facility])     Surgical History:  Past Surgical History:  Procedure Laterality Date  . None    . WISDOM TOOTH EXTRACTION      Medications:  Current Outpatient Medications on File Prior to Visit  Medication Sig  . [DISCONTINUED] fluticasone (FLONASE) 50 MCG/ACT nasal spray Place 2 sprays  into both nostrils daily.   No current facility-administered medications on file prior to visit.    Allergies:  Allergies  Allergen Reactions  . Penicillins Hives    Social History:  Social History   Socioeconomic History  . Marital status: Married    Spouse name: Not on file  . Number of children: Not on file  . Years of education: Not on file  . Highest education level: Not on file  Occupational History  . Not on file  Tobacco Use  . Smoking status: Never Smoker  . Smokeless tobacco: Never Used  Vaping Use  . Vaping Use: Never used  Substance and Sexual Activity  . Alcohol use: No    Comment: denies  . Drug use: No  . Sexual activity: Yes  Other Topics Concern  . Not on file  Social History Narrative  . Not on file   Social Determinants of Health   Financial Resource Strain: Not on file  Food Insecurity: Not on file  Transportation Needs: Not on file  Physical Activity: Not on file  Stress: Not on file  Social Connections: Not on file  Intimate Partner Violence: Not on file   Social History   Tobacco Use  Smoking Status Never Smoker  Smokeless Tobacco Never  Used   Social History   Substance and Sexual Activity  Alcohol Use No   Comment: denies    Family History:  Family History  Problem Relation Age of Onset  . Hypertension Mother   . Hypertension Father     Past medical history, surgical history, medications, allergies, family history and social history reviewed with patient today and changes made to appropriate areas of the chart.   Review of Systems - Denies CP, Sob, difficulties eating/drinking/voiding/stooling, leg edema, abd pain All other ROS negative except what is listed above and in the HPI.      Objective:    BP (!) 148/102 (BP Location: Left Arm, Cuff Size: Large)   Pulse 87   Temp 98.1 F (36.7 C) (Oral)   Ht 5' 10.1" (1.781 m)   Wt (!) 363 lb 6.4 oz (164.8 kg)   SpO2 97%   BMI 51.99 kg/m   Wt Readings from Last 3  Encounters:  07/19/20 (!) 363 lb 6.4 oz (164.8 kg)  11/14/19 (!) 371 lb 2 oz (168.3 kg)  10/31/19 (!) 370 lb (167.8 kg)    Physical Exam Vitals reviewed.  Constitutional:      General: He is not in acute distress.    Appearance: He is obese.  HENT:     Head: Normocephalic.     Right Ear: External ear normal.     Left Ear: External ear normal.  Eyes:     Extraocular Movements: Extraocular movements intact.  Cardiovascular:     Rate and Rhythm: Normal rate and regular rhythm.     Heart sounds: Normal heart sounds. No murmur heard.   Pulmonary:     Effort: No respiratory distress.     Breath sounds: Normal breath sounds.  Abdominal:     General: Bowel sounds are normal.     Palpations: Abdomen is soft.     Tenderness: There is no abdominal tenderness. There is no guarding or rebound.  Musculoskeletal:        General: Normal range of motion.  Skin:    General: Skin is warm and dry.  Neurological:     General: No focal deficit present.     Mental Status: He is alert and oriented to person, place, and time.  Psychiatric:        Mood and Affect: Mood normal.        Behavior: Behavior normal.     Results for orders placed or performed in visit on 08/27/17  Veritor Flu A/B Waived  Result Value Ref Range   Influenza A Positive (A) Negative   Influenza B Negative Negative      Assessment & Plan:   Problem List Items Addressed This Visit      Cardiovascular and Mediastinum   Hypertension - Primary    Uncontrolled. Will start losartan, amlodipine refilled. Will obtain labs and urine micro today.      Relevant Medications   amLODipine (NORVASC) 10 MG tablet   losartan (COZAAR) 25 MG tablet   Other Relevant Orders   Lipid panel   Basic Metabolic Panel (BMET)   POCT UA - Microalbumin     Respiratory   Sleep apnea    Has not had successful sleep study yet, will order today. Continue breathe right strips.      Relevant Orders   Nocturnal polysomnography (NPSG)      Other   Morbid obesity (HCC)    Recommended weight loss through lifestyle changes such as diet and exercise. Handout  provided. Will obtain screening labs today.      Gout    Doing well without flares after changing diet.          LABORATORY TESTING:  Health maintenance labs ordered today as discussed above.    IMMUNIZATIONS:   - Tdap: Tetanus vaccination status reviewed: last tetanus booster within 10 years. - Influenza: postponed to next month - Pneumovax: Not applicable - Prevnar: Not applicable - HPV: Not applicable - Shingrix vaccine: Not applicable  - COVID vaccine: refused. Previously had COVID last year.  SCREENING: - Colonoscopy: Not applicable  Discussed with patient purpose of the colonoscopy is to detect colon cancer at curable precancerous or early stages   - AAA Screening: Not applicable  - Lung Cancer Screening: N/a  PATIENT COUNSELING:    Sexuality: Discussed sexually transmitted diseases, partner selection, use of condoms, avoidance of unintended pregnancy  and contraceptive alternatives.   Advised to avoid cigarette smoking.  I discussed with the patient that most people either abstain from alcohol or drink within safe limits (<=14/week and <=4 drinks/occasion for males, <=7/weeks and <= 3 drinks/occasion for females) and that the risk for alcohol disorders and other health effects rises proportionally with the number of drinks per week and how often a drinker exceeds daily limits.  Discussed cessation/primary prevention of drug use and availability of treatment for abuse.   Diet: Encouraged to adjust caloric intake to maintain  or achieve ideal body weight, to reduce intake of dietary saturated fat and total fat, to limit sodium intake by avoiding high sodium foods and not adding table salt, and to maintain adequate dietary potassium and calcium preferably from fresh fruits, vegetables, and low-fat dairy products.    stressed the importance of regular  exercise  Injury prevention: Discussed safety belts, safety helmets, smoke detector, smoking near bedding or upholstery.   Dental health: Discussed importance of regular tooth brushing, flossing, and dental visits.   Follow up plan: NEXT PREVENTATIVE PHYSICAL DUE IN 1 YEAR. Return in about 4 weeks (around 08/16/2020) for HTN.

## 2020-07-19 NOTE — Assessment & Plan Note (Signed)
Uncontrolled. Will start losartan, amlodipine refilled. Will obtain labs and urine micro today.

## 2020-07-20 ENCOUNTER — Encounter: Payer: Self-pay | Admitting: Family Medicine

## 2020-07-20 LAB — BASIC METABOLIC PANEL
BUN/Creatinine Ratio: 8 — ABNORMAL LOW (ref 9–20)
BUN: 13 mg/dL (ref 6–24)
CO2: 24 mmol/L (ref 20–29)
Calcium: 9.8 mg/dL (ref 8.7–10.2)
Chloride: 103 mmol/L (ref 96–106)
Creatinine, Ser: 1.54 mg/dL — ABNORMAL HIGH (ref 0.76–1.27)
GFR calc Af Amer: 61 mL/min/{1.73_m2} (ref >59)
GFR calc non Af Amer: 53 mL/min/{1.73_m2} — ABNORMAL LOW (ref >59)
Glucose: 90 mg/dL (ref 65–99)
Potassium: 5 mmol/L (ref 3.5–5.2)
Sodium: 144 mmol/L (ref 134–144)

## 2020-07-20 LAB — LIPID PANEL
Chol/HDL Ratio: 5.1 ratio — ABNORMAL HIGH (ref 0.0–5.0)
Cholesterol, Total: 202 mg/dL — ABNORMAL HIGH (ref 100–199)
HDL: 40 mg/dL (ref >39)
LDL Chol Calc (NIH): 138 mg/dL — ABNORMAL HIGH (ref 0–99)
Triglycerides: 134 mg/dL (ref 0–149)
VLDL Cholesterol Cal: 24 mg/dL (ref 5–40)

## 2020-07-23 ENCOUNTER — Other Ambulatory Visit: Payer: Self-pay | Admitting: Family Medicine

## 2020-07-23 DIAGNOSIS — E785 Hyperlipidemia, unspecified: Secondary | ICD-10-CM

## 2020-07-23 MED ORDER — ROSUVASTATIN CALCIUM 5 MG PO TABS: 5.0000 mg | ORAL_TABLET | Freq: Every day | ORAL | 3 refills | Status: DC

## 2020-07-24 ENCOUNTER — Encounter: Payer: 59 | Admitting: Family Medicine

## 2020-10-12 ENCOUNTER — Other Ambulatory Visit: Payer: Self-pay | Admitting: Family Medicine

## 2021-01-09 ENCOUNTER — Other Ambulatory Visit: Payer: Self-pay | Admitting: Family Medicine

## 2021-01-09 ENCOUNTER — Other Ambulatory Visit: Payer: Self-pay | Admitting: Nurse Practitioner

## 2021-01-09 NOTE — Telephone Encounter (Signed)
Requested medication (s) are due for refill today:yes   Requested medication (s) are on the active medication list: yes   Last refill:  10/12/2020  Future visit scheduled: no  Notes to clinic:  overdue for follow up appointment  Message sent to patient to contact office    Requested Prescriptions  Pending Prescriptions Disp Refills   amLODipine (NORVASC) 10 MG tablet [Pharmacy Med Name: amLODIPine Besylate 10 MG Oral Tablet] 90 tablet 0    Sig: Take 1 tablet by mouth once daily      Cardiovascular:  Calcium Channel Blockers Failed - 01/09/2021  4:00 AM      Failed - Last BP in normal range    BP Readings from Last 1 Encounters:  07/19/20 (!) 148/102          Passed - Valid encounter within last 6 months    Recent Outpatient Visits           5 months ago Primary hypertension   [facility] Caro Laroche, DO   1 year ago Essential hypertension   [facility] Particia Nearing, [place]   1 year ago Essential hypertension   [facility] Roosvelt Maser [place], [place]   3 years ago Influenza A   [facility] Roosvelt Maser [place], [place]   5 years ago Immunization due   [facility] Gabriel Cirri, NP

## 2021-01-09 NOTE — Telephone Encounter (Signed)
Lvm to give us a call back

## 2021-01-09 NOTE — Telephone Encounter (Signed)
Pt is overdue for f/u

## 2021-01-10 NOTE — Telephone Encounter (Signed)
Lvm to give us a call back

## 2021-01-17 ENCOUNTER — Encounter: Payer: Self-pay | Admitting: Nurse Practitioner

## 2021-01-17 ENCOUNTER — Ambulatory Visit (INDEPENDENT_AMBULATORY_CARE_PROVIDER_SITE_OTHER): Payer: 59 | Admitting: Nurse Practitioner

## 2021-01-17 ENCOUNTER — Other Ambulatory Visit: Payer: Self-pay

## 2021-01-17 VITALS — BP 142/100 | HR 80 | Temp 98.8°F | Wt 367.0 lb

## 2021-01-17 DIAGNOSIS — I10 Essential (primary) hypertension: Secondary | ICD-10-CM

## 2021-01-17 MED ORDER — AMLODIPINE BESYLATE 10 MG PO TABS: 10.0000 mg | ORAL_TABLET | Freq: Every day | ORAL | 1 refills | Status: DC

## 2021-01-17 MED ORDER — LOSARTAN POTASSIUM 50 MG PO TABS: 80.0000 mg | ORAL_TABLET | Freq: Every day | ORAL | 1 refills | Status: DC

## 2021-01-17 NOTE — Assessment & Plan Note (Signed)
Recommend a diet low in fat and exercise regularly.

## 2021-01-17 NOTE — Assessment & Plan Note (Signed)
Chronic.  Uncontrolled. Will increase losartan to 50mg  daily. Continue with amlodipine. Refills sent today. Labs ordered. Continue to check blood pressures at home.  Follow up in 1 month. Call sooner if concerns arise.

## 2021-01-17 NOTE — Progress Notes (Signed)
BP (!) 142/100   Pulse 80   Temp 98.8 F (37.1 C)   Wt (!) 367 lb (166.5 kg)   SpO2 95%   BMI 52.51 kg/m    Subjective:    Patient ID: Alan Barnes, male    DOB: [birth year], 50 y.o.   MRN: [id number]  HPI: Alan Barnes is a 50 y.o. male  Chief Complaint  Patient presents with   Hypertension   HYPERTENSION / HYPERLIPIDEMIA Satisfied with current treatment? yes Duration of hypertension: years BP monitoring frequency: weekly BP range: 130-140/80 BP medication side effects: no Past BP meds: amlodipine and losartan Duration of hyperlipidemia: years Cholesterol medication side effects: no Cholesterol supplements: none Past cholesterol medications: rosuvastatin (crestor) Medication compliance: excellent compliance Aspirin: no Recent stressors: no Recurrent headaches: no Visual changes: no Palpitations: no Dyspnea: no Chest pain: no Lower extremity edema: no Dizzy/lightheaded: no  CHRONIC KIDNEY DISEASE CKD status: controlled Medications renally dose: yes Previous renal evaluation: no Pneumovax:  Up to Date Influenza Vaccine:  Up to Date   Relevant past medical, surgical, family and social history reviewed and updated as indicated. Interim medical history since our last visit reviewed. Allergies and medications reviewed and updated.  Review of Systems  Eyes:  Negative for visual disturbance.  Respiratory:  Negative for shortness of breath.   Cardiovascular:  Negative for chest pain and leg swelling.  Neurological:  Negative for light-headedness and headaches.   Per HPI unless specifically indicated above     Objective:    BP (!) 142/100   Pulse 80   Temp 98.8 F (37.1 C)   Wt (!) 367 lb (166.5 kg)   SpO2 95%   BMI 52.51 kg/m   Wt Readings from Last 3 Encounters:  01/17/21 (!) 367 lb (166.5 kg)  07/19/20 (!) 363 lb 6.4 oz (164.8 kg)  11/14/19 (!) 371 lb 2 oz (168.3 kg)    Physical Exam Vitals and nursing note reviewed.  Constitutional:       General: He is not in acute distress.    Appearance: Normal appearance. He is obese. He is not ill-appearing, toxic-appearing or diaphoretic.  HENT:     Head: Normocephalic.     Right Ear: External ear normal.     Left Ear: External ear normal.     Nose: Nose normal. No congestion or rhinorrhea.     Mouth/Throat:     Mouth: Mucous membranes are moist.  Eyes:     General:        Right eye: No discharge.        Left eye: No discharge.     Extraocular Movements: Extraocular movements intact.     Conjunctiva/sclera: Conjunctivae normal.     Pupils: Pupils are equal, round, and reactive to light.  Cardiovascular:     Rate and Rhythm: Normal rate and regular rhythm.     Heart sounds: No murmur heard. Pulmonary:     Effort: Pulmonary effort is normal. No respiratory distress.     Breath sounds: Normal breath sounds. No wheezing, rhonchi or rales.  Abdominal:     General: Abdomen is flat. Bowel sounds are normal.  Musculoskeletal:     Cervical back: Normal range of motion and neck supple.  Skin:    General: Skin is warm and dry.     Capillary Refill: Capillary refill takes less than 2 seconds.  Neurological:     General: No focal deficit present.     Mental Status: He is alert and oriented to  person, place, and time.  Psychiatric:        Mood and Affect: Mood normal.        Behavior: Behavior normal.        Thought Content: Thought content normal.        Judgment: Judgment normal.    Results for orders placed or performed in visit on 07/19/20  Lipid panel  Result Value Ref Range   Cholesterol, Total 202 (H) 100 - 199 mg/dL   Triglycerides 134 0 - 149 mg/dL   HDL 40 >39 mg/dL   VLDL Cholesterol Cal 24 5 - 40 mg/dL   LDL Chol Calc (NIH) 138 (H) 0 - 99 mg/dL   Chol/HDL Ratio 5.1 (H) 0.0 - 5.0 ratio  Basic Metabolic Panel (BMET)  Result Value Ref Range   Glucose 90 65 - 99 mg/dL   BUN 13 6 - 24 mg/dL   Creatinine, Ser 1.54 (H) 0.76 - 1.27 mg/dL   GFR calc non Af Amer 53 (L)  >59 mL/min/1.73   GFR calc Af Amer 61 >59 mL/min/1.73   BUN/Creatinine Ratio 8 (L) 9 - 20   Sodium 144 134 - 144 mmol/L   Potassium 5.0 3.5 - 5.2 mmol/L   Chloride 103 96 - 106 mmol/L   CO2 24 20 - 29 mmol/L   Calcium 9.8 8.7 - 10.2 mg/dL  Bayer DCA Hb A1c Waived (STAT)  Result Value Ref Range   HB A1C (BAYER DCA - WAIVED) 5.4 <7.0 %  Microalbumin, Urine Waived  Result Value Ref Range   Microalb, Ur Waived 30 (H) 0 - 19 mg/L   Creatinine, Urine Waived 200 10 - 300 mg/dL   Microalb/Creat Ratio <30 <30 mg/g      Assessment & Plan:   Problem List Items Addressed This Visit       Cardiovascular and Mediastinum   Hypertension - Primary    Chronic.  Uncontrolled. Will increase losartan to 55m daily. Continue with amlodipine. Refills sent today. Labs ordered. Continue to check blood pressures at home.  Follow up in 1 month. Call sooner if concerns arise.        Relevant Medications   losartan (COZAAR) 50 MG tablet   amLODipine (NORVASC) 10 MG tablet   Other Relevant Orders   Comp Met (CMET)   Lipid Profile     Other   Morbid obesity ([facility]    Recommend a diet low in fat and exercise regularly.        Relevant Orders   Comp Met (CMET)   Lipid Profile     Follow up plan: Return in about 1 month (around 02/17/2021) for BP Check.

## 2021-01-18 LAB — COMPREHENSIVE METABOLIC PANEL
ALT: 30 IU/L (ref 0–44)
AST: 23 IU/L (ref 0–40)
Albumin/Globulin Ratio: 1.9 (ref 1.2–2.2)
Albumin: 4.7 g/dL (ref 4.0–5.0)
Alkaline Phosphatase: 89 IU/L (ref 44–121)
BUN/Creatinine Ratio: 9 (ref 9–20)
BUN: 11 mg/dL (ref 6–24)
Bilirubin Total: 0.4 mg/dL (ref 0.0–1.2)
CO2: 26 mmol/L (ref 20–29)
Calcium: 9.4 mg/dL (ref 8.7–10.2)
Chloride: 102 mmol/L (ref 96–106)
Creatinine, Ser: 1.21 mg/dL (ref 0.76–1.27)
Globulin, Total: 2.5 g/dL (ref 1.5–4.5)
Glucose: 98 mg/dL (ref 65–99)
Potassium: 4.4 mmol/L (ref 3.5–5.2)
Sodium: 141 mmol/L (ref 134–144)
Total Protein: 7.2 g/dL (ref 6.0–8.5)
eGFR: 74 mL/min/{1.73_m2} (ref >59)

## 2021-01-18 LAB — LIPID PANEL
Chol/HDL Ratio: 2.9 ratio (ref 0.0–5.0)
Cholesterol, Total: 119 mg/dL (ref 100–199)
HDL: 41 mg/dL (ref >39)
LDL Chol Calc (NIH): 55 mg/dL (ref 0–99)
Triglycerides: 129 mg/dL (ref 0–149)
VLDL Cholesterol Cal: 23 mg/dL (ref 5–40)

## 2021-01-18 NOTE — Progress Notes (Signed)
Hi Daxton.  Your lab work looks good.  Cholesterol and kidney function have improved.  Keep up the good work.  See you in 6 months.

## 2021-01-21 NOTE — Telephone Encounter (Signed)
Pt has apt on 02/20/2021

## 2021-01-22 NOTE — Telephone Encounter (Signed)
I sent this in 5 days ago.

## 2021-02-20 ENCOUNTER — Ambulatory Visit (INDEPENDENT_AMBULATORY_CARE_PROVIDER_SITE_OTHER): Payer: 59 | Admitting: Nurse Practitioner

## 2021-02-20 ENCOUNTER — Encounter: Payer: Self-pay | Admitting: Nurse Practitioner

## 2021-02-20 ENCOUNTER — Other Ambulatory Visit: Payer: Self-pay

## 2021-02-20 VITALS — BP 150/99 | HR 76 | Temp 98.7°F | Wt 371.0 lb

## 2021-02-20 DIAGNOSIS — I10 Essential (primary) hypertension: Secondary | ICD-10-CM

## 2021-02-20 NOTE — Assessment & Plan Note (Signed)
Chronic.  Controlled.  Elevated at visit but checks at home a couple of times a week and after starting the increased dose of Losartan it is 130/80s at home. Continue with current medication regimen or Amlodipine 10mg  and Losartan 75mg .  Discussed with patient that he should return to clinic if blood pressure is elevated to 140/90s.  Return to clinic in 5 months for reevaluation.  Call sooner if concerns arise.

## 2021-02-20 NOTE — Progress Notes (Signed)
BP (!) 150/99   Pulse 76   Temp 98.7 F (37.1 C)   Wt (!) 371 lb (168.3 kg)   SpO2 97%   BMI 53.08 kg/m    Subjective:    Patient ID: Alan Barnes, male    DOB: 24-Nov-1970, 50 y.o.   MRN: [id number]  HPI: Alan Barnes is a 50 y.o. male  Chief Complaint  Patient presents with   Hypertension    HYPERTENSION Hypertension status: controlled  Satisfied with current treatment? yes Duration of hypertension: years BP monitoring frequency:  weekly BP range: 130/80 BP medication side effects:  no Medication compliance: excellent compliance Previous BP meds:amlodipine and losartan (cozaar) Aspirin: no Recurrent headaches: no Visual changes: no Palpitations: no Dyspnea: no Chest pain: no Lower extremity edema: no Dizzy/lightheaded: no  Relevant past medical, surgical, family and social history reviewed and updated as indicated. Interim medical history since our last visit reviewed. Allergies and medications reviewed and updated.  Review of Systems  Eyes:  Negative for visual disturbance.  Respiratory:  Negative for shortness of breath.   Cardiovascular:  Negative for chest pain and leg swelling.  Neurological:  Negative for light-headedness and headaches.   Per HPI unless specifically indicated above     Objective:    BP (!) 150/99   Pulse 76   Temp 98.7 F (37.1 C)   Wt (!) 371 lb (168.3 kg)   SpO2 97%   BMI 53.08 kg/m   Wt Readings from Last 3 Encounters:  02/20/21 (!) 371 lb (168.3 kg)  01/17/21 (!) 367 lb (166.5 kg)  07/19/20 (!) 363 lb 6.4 oz (164.8 kg)    Physical Exam Vitals and nursing note reviewed.  Constitutional:      General: He is not in acute distress.    Appearance: Normal appearance. He is obese. He is not ill-appearing, toxic-appearing or diaphoretic.  HENT:     Head: Normocephalic.     Right Ear: External ear normal.     Left Ear: External ear normal.     Nose: Nose normal. No congestion or rhinorrhea.     Mouth/Throat:      Mouth: Mucous membranes are moist.  Eyes:     General:        Right eye: No discharge.        Left eye: No discharge.     Extraocular Movements: Extraocular movements intact.     Conjunctiva/sclera: Conjunctivae normal.     Pupils: Pupils are equal, round, and reactive to light.  Cardiovascular:     Rate and Rhythm: Normal rate and regular rhythm.     Heart sounds: No murmur heard. Pulmonary:     Effort: Pulmonary effort is normal. No respiratory distress.     Breath sounds: Normal breath sounds. No wheezing, rhonchi or rales.  Abdominal:     General: Abdomen is flat. Bowel sounds are normal.  Musculoskeletal:     Cervical back: Normal range of motion and neck supple.  Skin:    General: Skin is warm and dry.     Capillary Refill: Capillary refill takes less than 2 seconds.  Neurological:     General: No focal deficit present.     Mental Status: He is alert and oriented to person, place, and time.  Psychiatric:        Mood and Affect: Mood normal.        Behavior: Behavior normal.        Thought Content: Thought content normal.  Judgment: Judgment normal.    Results for orders placed or performed in visit on 01/17/21  Comp Met (CMET)  Result Value Ref Range   Glucose 98 65 - 99 mg/dL   BUN 11 6 - 24 mg/dL   Creatinine, Ser 1.21 0.76 - 1.27 mg/dL   eGFR 74 >59 mL/min/1.73   BUN/Creatinine Ratio 9 9 - 20   Sodium 141 134 - 144 mmol/L   Potassium 4.4 3.5 - 5.2 mmol/L   Chloride 102 96 - 106 mmol/L   CO2 26 20 - 29 mmol/L   Calcium 9.4 8.7 - 10.2 mg/dL   Total Protein 7.2 6.0 - 8.5 g/dL   Albumin 4.7 4.0 - 5.0 g/dL   Globulin, Total 2.5 1.5 - 4.5 g/dL   Albumin/Globulin Ratio 1.9 1.2 - 2.2   Bilirubin Total 0.4 0.0 - 1.2 mg/dL   Alkaline Phosphatase 89 44 - 121 IU/L   AST 23 0 - 40 IU/L   ALT 30 0 - 44 IU/L  Lipid Profile  Result Value Ref Range   Cholesterol, Total 119 100 - 199 mg/dL   Triglycerides 129 0 - 149 mg/dL   HDL 41 >39 mg/dL   VLDL Cholesterol  Cal 23 5 - 40 mg/dL   LDL Chol Calc (NIH) 55 0 - 99 mg/dL   Chol/HDL Ratio 2.9 0.0 - 5.0 ratio      Assessment & Plan:   Problem List Items Addressed This Visit       Cardiovascular and Mediastinum   Hypertension - Primary    Chronic.  Controlled.  Elevated at visit but checks at home a couple of times a week and after starting the increased dose of Losartan it is 130/80s at home. Continue with current medication regimen or Amlodipine 31m and Losartan 76m  Discussed with patient that he should return to clinic if blood pressure is elevated to 140/90s.  Return to clinic in 5 months for reevaluation.  Call sooner if concerns arise.           Follow up plan: Return in about 5 months (around 07/23/2021) for HTN, HLD, DM2 FU.

## 2021-06-19 ENCOUNTER — Other Ambulatory Visit: Payer: Self-pay | Admitting: Family Medicine

## 2021-06-19 NOTE — Telephone Encounter (Signed)
Requested medications are due for refill today.  yes  Requested medications are on the active medications list.  yes  Last refill. 07/23/2020  Future visit scheduled.   yes  Notes to clinic.  Rx signed by Dr. Linwood Dibbles.    Requested Prescriptions  Pending Prescriptions Disp Refills   rosuvastatin (CRESTOR) 5 MG tablet [Pharmacy Med Name: Rosuvastatin Calcium 5 MG Oral Tablet] 90 tablet 0    Sig: Take 1 tablet by mouth once daily     There is no refill protocol information for this order

## 2021-07-08 ENCOUNTER — Other Ambulatory Visit: Payer: Self-pay | Admitting: Nurse Practitioner

## 2021-07-08 DIAGNOSIS — I10 Essential (primary) hypertension: Secondary | ICD-10-CM

## 2021-07-09 NOTE — Telephone Encounter (Signed)
Requested Prescriptions  Pending Prescriptions Disp Refills   amLODipine (NORVASC) 10 MG tablet [Pharmacy Med Name: amLODIPine Besylate 10 MG Oral Tablet] 90 tablet 0    Sig: Take 1 tablet by mouth once daily     Cardiovascular:  Calcium Channel Blockers Failed - 07/08/2021  2:20 AM      Failed - Last BP in normal range    BP Readings from Last 1 Encounters:  02/20/21 (!) 150/99         Passed - Valid encounter within last 6 months    Recent Outpatient Visits          4 months ago Primary hypertension   [facility] Larae Grooms, NP   5 months ago Primary hypertension   [facility] Larae Grooms, NP   11 months ago Primary hypertension   [facility] Caro Laroche, DO   1 year ago Essential hypertension   [facility] Particia Nearing, [place]   1 year ago Essential hypertension   [facility] Particia [place]earing, [place]      Future Appointments            In 1 week Larae Grooms, NP [facility]

## 2021-07-22 ENCOUNTER — Other Ambulatory Visit: Payer: Self-pay

## 2021-07-22 ENCOUNTER — Encounter: Payer: Self-pay | Admitting: Nurse Practitioner

## 2021-07-22 ENCOUNTER — Ambulatory Visit (INDEPENDENT_AMBULATORY_CARE_PROVIDER_SITE_OTHER): Payer: 59 | Admitting: Nurse Practitioner

## 2021-07-22 VITALS — BP 151/89 | HR 91 | Temp 98.5°F | Ht 69.0 in | Wt 373.6 lb

## 2021-07-22 DIAGNOSIS — I10 Essential (primary) hypertension: Secondary | ICD-10-CM

## 2021-07-22 DIAGNOSIS — Z0000 Encounter for general adult medical examination without abnormal findings: Secondary | ICD-10-CM | POA: Diagnosis not present

## 2021-07-22 MED ORDER — AMLODIPINE BESYLATE 10 MG PO TABS: 10.0000 mg | ORAL_TABLET | Freq: Every day | ORAL | 5 refills | Status: DC

## 2021-07-22 MED ORDER — LOSARTAN POTASSIUM 50 MG PO TABS: 75.0000 mg | ORAL_TABLET | Freq: Every day | ORAL | 5 refills | Status: DC

## 2021-07-22 MED ORDER — ROSUVASTATIN CALCIUM 5 MG PO TABS: 5.0000 mg | ORAL_TABLET | Freq: Every day | ORAL | 5 refills | Status: DC

## 2021-07-22 NOTE — Assessment & Plan Note (Signed)
Chronic.  Controlled.  Continue with current medication regimen on Amlodipine 10mg  and Losartan 75mg  daily.  Refills sent today.  Continue to monitor blood pressures at home.  Call if >140/90.  Labs ordered today.  Return to clinic in 6 months for reevaluation.  Call sooner if concerns arise.

## 2021-07-22 NOTE — Progress Notes (Signed)
BP (!) 151/89 (BP Location: Left Arm, Cuff Size: Large)    Pulse 91    Temp 98.5 F (36.9 C) (Oral)    Ht _0  (1.753 m)    Wt (!) 373 lb 9.6 oz (169.5 kg)    SpO2 96%    BMI 55.17 kg/m    Subjective:    Patient ID: Alan Barnes, male    DOB: 05/04/1971, 51 y.o.   MRN: [id number]  HPI: Siddiq Kaluzny is a 51 y.o. male presenting on 07/22/2021 for comprehensive medical examination. Current medical complaints include:none  He currently lives with: Interim Problems from his last visit: no  HYPERTENSION Hypertension status: controlled  Satisfied with current treatment? no Duration of hypertension: years BP monitoring frequency:  daily BP range: 130/80 BP medication side effects:  no Medication compliance: excellent compliance Previous BP meds:amlodipine and losartan (cozaar) Aspirin: no Recurrent headaches: no Visual changes: no Palpitations: no Dyspnea: no Chest pain: no Lower extremity edema: no Dizzy/lightheaded: no  Depression Screen done today and results listed below:  Depression screen [facility] 2/9 07/22/2021 07/19/2020 10/31/2019  Decreased Interest 0 0 0  Down, Depressed, Hopeless 0 0 0  PHQ - 2 Score 0 0 0  Altered sleeping 0 - 0  Tired, decreased energy 0 - 0  Change in appetite 0 - 0  Feeling bad or failure about yourself  0 - 0  Trouble concentrating 0 - 0  Moving slowly or fidgety/restless 0 - 0  Suicidal thoughts 0 - 0  PHQ-9 Score 0 - 0  Difficult doing work/chores Not difficult at all - -    The patient does not have a history of falls. I did complete a risk assessment for falls. A plan of care for falls was documented.   Past Medical History:  Past Medical History:  Diagnosis Date   Gout    Hypertension    Morbid obesity ([place])     Surgical History:  Past Surgical History:  Procedure Laterality Date   None     WISDOM TOOTH EXTRACTION      Medications:  Current Outpatient Medications on File Prior to Visit  Medication Sig   [DISCONTINUED]  fluticasone (FLONASE) 50 MCG/ACT nasal spray Place 2 sprays into both nostrils daily.   No current facility-administered medications on file prior to visit.    Allergies:  Allergies  Allergen Reactions   Penicillins Hives    Social History:  Social History   Socioeconomic History   Marital status: Married    Spouse name: Not on file   Number of children: Not on file   Years of education: Not on file   Highest education level: Not on file  Occupational History   Not on file  Tobacco Use   Smoking status: Never   Smokeless tobacco: Never  Vaping Use   Vaping Use: Never used  Substance and Sexual Activity   Alcohol use: No    Comment: denies   Drug use: No   Sexual activity: Yes  Other Topics Concern   Not on file  Social History Narrative   Not on file   Social Determinants of Health   Financial Resource Strain: Not on file  Food Insecurity: Not on file  Transportation Needs: Not on file  Physical Activity: Not on file  Stress: Not on file  Social Connections: Not on file  Intimate Partner Violence: Not on file   Social History   Tobacco Use  Smoking Status Never  Smokeless Tobacco Never  Social History   Substance and Sexual Activity  Alcohol Use No   Comment: denies    Family History:  Family History  Problem Relation Age of Onset   Hypertension Mother    Hypertension Father     Past medical history, surgical history, medications, allergies, family history and social history reviewed with patient today and changes made to appropriate areas of the chart.   Review of Systems  Eyes:  Negative for blurred vision and double vision.  Respiratory:  Negative for shortness of breath.   Cardiovascular:  Negative for chest pain, palpitations and leg swelling.  Neurological:  Negative for dizziness and headaches.  All other ROS negative except what is listed above and in the HPI.      Objective:    BP (!) 151/89 (BP Location: Left Arm, Cuff Size:  Large)    Pulse 91    Temp 98.5 F (36.9 C) (Oral)    Ht _0  (1.753 m)    Wt (!) 373 lb 9.6 oz (169.5 kg)    SpO2 96%    BMI 55.17 kg/m   Wt Readings from Last 3 Encounters:  07/22/21 (!) 373 lb 9.6 oz (169.5 kg)  02/20/21 (!) 371 lb (168.3 kg)  01/17/21 (!) 367 lb (166.5 kg)    Physical Exam Vitals and nursing note reviewed.  Constitutional:      General: He is not in acute distress.    Appearance: Normal appearance. He is obese. He is not ill-appearing, toxic-appearing or diaphoretic.  HENT:     Head: Normocephalic.     Right Ear: Tympanic membrane, ear canal and external ear normal.     Left Ear: Tympanic membrane, ear canal and external ear normal.     Nose: Nose normal. No congestion or rhinorrhea.     Mouth/Throat:     Mouth: Mucous membranes are moist.  Eyes:     General:        Right eye: No discharge.        Left eye: No discharge.     Extraocular Movements: Extraocular movements intact.     Conjunctiva/sclera: Conjunctivae normal.     Pupils: Pupils are equal, round, and reactive to light.  Cardiovascular:     Rate and Rhythm: Normal rate and regular rhythm.     Heart sounds: No murmur heard. Pulmonary:     Effort: Pulmonary effort is normal. No respiratory distress.     Breath sounds: Normal breath sounds. No wheezing, rhonchi or rales.  Abdominal:     General: Abdomen is flat. Bowel sounds are normal. There is no distension.     Palpations: Abdomen is soft.     Tenderness: There is no abdominal tenderness. There is no guarding.  Musculoskeletal:     Cervical back: Normal range of motion and neck supple.  Skin:    General: Skin is warm and dry.     Capillary Refill: Capillary refill takes less than 2 seconds.  Neurological:     General: No focal deficit present.     Mental Status: He is alert and oriented to person, place, and time.     Cranial Nerves: No cranial nerve deficit.     Motor: No weakness.     Deep Tendon Reflexes: Reflexes normal.   Psychiatric:        Mood and Affect: Mood normal.        Behavior: Behavior normal.        Thought Content: Thought content normal.  Judgment: Judgment normal.    Results for orders placed or performed in visit on 01/17/21  Comp Met (CMET)  Result Value Ref Range   Glucose 98 65 - 99 mg/dL   BUN 11 6 - 24 mg/dL   Creatinine, Ser 1.21 0.76 - 1.27 mg/dL   eGFR 74 >59 mL/min/1.73   BUN/Creatinine Ratio 9 9 - 20   Sodium 141 134 - 144 mmol/L   Potassium 4.4 3.5 - 5.2 mmol/L   Chloride 102 96 - 106 mmol/L   CO2 26 20 - 29 mmol/L   Calcium 9.4 8.7 - 10.2 mg/dL   Total Protein 7.2 6.0 - 8.5 g/dL   Albumin 4.7 4.0 - 5.0 g/dL   Globulin, Total 2.5 1.5 - 4.5 g/dL   Albumin/Globulin Ratio 1.9 1.2 - 2.2   Bilirubin Total 0.4 0.0 - 1.2 mg/dL   Alkaline Phosphatase 89 44 - 121 IU/L   AST 23 0 - 40 IU/L   ALT 30 0 - 44 IU/L  Lipid Profile  Result Value Ref Range   Cholesterol, Total 119 100 - 199 mg/dL   Triglycerides 129 0 - 149 mg/dL   HDL 41 >39 mg/dL   VLDL Cholesterol Cal 23 5 - 40 mg/dL   LDL Chol Calc (NIH) 55 0 - 99 mg/dL   Chol/HDL Ratio 2.9 0.0 - 5.0 ratio      Assessment & Plan:   Problem List Items Addressed This Visit       Cardiovascular and Mediastinum   Hypertension    Chronic.  Controlled.  Continue with current medication regimen on Amlodipine 51m and Losartan 731mdaily.  Refills sent today.  Continue to monitor blood pressures at home.  Call if >140/90.  Labs ordered today.  Return to clinic in 6 months for reevaluation.  Call sooner if concerns arise.        Relevant Medications   amLODipine (NORVASC) 10 MG tablet   losartan (COZAAR) 50 MG tablet   rosuvastatin (CRESTOR) 5 MG tablet     Other   Morbid obesity ([place]   Recommend a healthy lifestyle through diet and exercise.      Other Visit Diagnoses     Annual physical exam    -  Primary   Health maintenance reviewed during visit. Necessary labs ordered. Insurance no longer covers labs.  Declines vaccines. Will check on Cologuard.    Relevant Orders   Lipid panel   Comprehensive metabolic panel        Discussed aspirin prophylaxis for myocardial infarction prevention and decision was it was not indicated  LABORATORY TESTING:  Health maintenance labs ordered today as discussed above.   The natural history of prostate cancer and ongoing controversy regarding screening and potential treatment outcomes of prostate cancer has been discussed with the patient. The meaning of a false positive PSA and a false negative PSA has been discussed. He indicates understanding of the limitations of this screening test and wishes to proceed with screening PSA testing.   IMMUNIZATIONS:   - Tdap: Tetanus vaccination status reviewed: last tetanus booster within 10 years. - Influenza: Refused - Pneumovax: Not applicable - Prevnar: Not applicable - COVID:  Discussed at visit - HPV: Not applicable - Shingrix vaccine: Not applicable  SCREENING: - Colonoscopy:  Checking with issuance to see if Cologuard is covered   Discussed with patient purpose of the colonoscopy is to detect colon cancer at curable precancerous or early stages   - AAA Screening: Not applicable  -  Hearing Test: Not applicable  -Spirometry: Not applicable   PATIENT COUNSELING:    Sexuality: Discussed sexually transmitted diseases, partner selection, use of condoms, avoidance of unintended pregnancy  and contraceptive alternatives.   Advised to avoid cigarette smoking.  I discussed with the patient that most people either abstain from alcohol or drink within safe limits (<=14/week and <=4 drinks/occasion for males, <=7/weeks and <= 3 drinks/occasion for females) and that the risk for alcohol disorders and other health effects rises proportionally with the number of drinks per week and how often a drinker exceeds daily limits.  Discussed cessation/primary prevention of drug use and availability of treatment for abuse.    Diet: Encouraged to adjust caloric intake to maintain  or achieve ideal body weight, to reduce intake of dietary saturated fat and total fat, to limit sodium intake by avoiding high sodium foods and not adding table salt, and to maintain adequate dietary potassium and calcium preferably from fresh fruits, vegetables, and low-fat dairy products.    stressed the importance of regular exercise  Injury prevention: Discussed safety belts, safety helmets, smoke detector, smoking near bedding or upholstery.   Dental health: Discussed importance of regular tooth brushing, flossing, and dental visits.   Follow up plan: NEXT PREVENTATIVE PHYSICAL DUE IN 1 YEAR. Return in about 6 months (around 01/19/2022) for HTN, HLD, DM2 FU.

## 2021-07-22 NOTE — Assessment & Plan Note (Signed)
Recommend a healthy lifestyle through diet and exercise.  °

## 2021-07-23 LAB — COMPREHENSIVE METABOLIC PANEL
ALT: 26 IU/L (ref 0–44)
AST: 20 IU/L (ref 0–40)
Albumin/Globulin Ratio: 1.8 (ref 1.2–2.2)
Albumin: 4.7 g/dL (ref 4.0–5.0)
Alkaline Phosphatase: 102 IU/L (ref 44–121)
BUN/Creatinine Ratio: 10 (ref 9–20)
BUN: 14 mg/dL (ref 6–24)
Bilirubin Total: 0.4 mg/dL (ref 0.0–1.2)
CO2: 22 mmol/L (ref 20–29)
Calcium: 9.4 mg/dL (ref 8.7–10.2)
Chloride: 102 mmol/L (ref 96–106)
Creatinine, Ser: 1.42 mg/dL — ABNORMAL HIGH (ref 0.76–1.27)
Globulin, Total: 2.6 g/dL (ref 1.5–4.5)
Glucose: 107 mg/dL — ABNORMAL HIGH (ref 70–99)
Potassium: 4.3 mmol/L (ref 3.5–5.2)
Sodium: 142 mmol/L (ref 134–144)
Total Protein: 7.3 g/dL (ref 6.0–8.5)
eGFR: 61 mL/min/{1.73_m2} (ref >59)

## 2021-07-23 LAB — LIPID PANEL
Chol/HDL Ratio: 4.1 ratio (ref 0.0–5.0)
Cholesterol, Total: 144 mg/dL (ref 100–199)
HDL: 35 mg/dL — ABNORMAL LOW (ref >39)
LDL Chol Calc (NIH): 57 mg/dL (ref 0–99)
Triglycerides: 333 mg/dL — ABNORMAL HIGH (ref 0–149)
VLDL Cholesterol Cal: 52 mg/dL — ABNORMAL HIGH (ref 5–40)

## 2021-07-23 NOTE — Progress Notes (Signed)
Please let patient know that his lab work looks good.  His triglycerides are elevated.  Recommend decreasing processed food and refined sugar intake.  His kidney function declined slightly from prior.  We will need to work hard to keep his blood pressure under control. Follow up as discussed.

## 2022-01-19 ENCOUNTER — Encounter: Payer: Self-pay | Admitting: Nurse Practitioner

## 2022-01-20 NOTE — Progress Notes (Deleted)
There were no vitals taken for this visit.   Subjective:    Patient ID: Alan Barnes, male    DOB: 1971/04/27, 51 y.o.   MRN: [id number]  HPI: Alan Barnes is a 51 y.o. male  No chief complaint on file.   HYPERTENSION Hypertension status: controlled  Satisfied with current treatment? yes Duration of hypertension: years BP monitoring frequency:  weekly BP range: 130/80 BP medication side effects:  no Medication compliance: excellent compliance Previous BP meds:amlodipine and losartan (cozaar) Aspirin: no Recurrent headaches: no Visual changes: no Palpitations: no Dyspnea: no Chest pain: no Lower extremity edema: no Dizzy/lightheaded: no  Relevant past medical, surgical, family and social history reviewed and updated as indicated. Interim medical history since our last visit reviewed. Allergies and medications reviewed and updated.  Review of Systems  Eyes:  Negative for visual disturbance.  Respiratory:  Negative for shortness of breath.   Cardiovascular:  Negative for chest pain and leg swelling.  Neurological:  Negative for light-headedness and headaches.   Per HPI unless specifically indicated above     Objective:    There were no vitals taken for this visit.  Wt Readings from Last 3 Encounters:  07/22/21 (!) 373 lb 9.6 oz (169.5 kg)  02/20/21 (!) 371 lb (168.3 kg)  01/17/21 (!) 367 lb (166.5 kg)    Physical Exam Vitals and nursing note reviewed.  Constitutional:      General: He is not in acute distress.    Appearance: Normal appearance. He is obese. He is not ill-appearing, toxic-appearing or diaphoretic.  HENT:     Head: Normocephalic.     Right Ear: External ear normal.     Left Ear: External ear normal.     Nose: Nose normal. No congestion or rhinorrhea.     Mouth/Throat:     Mouth: Mucous membranes are moist.  Eyes:     General:        Right eye: No discharge.        Left eye: No discharge.     Extraocular Movements: Extraocular  movements intact.     Conjunctiva/sclera: Conjunctivae normal.     Pupils: Pupils are equal, round, and reactive to light.  Cardiovascular:     Rate and Rhythm: Normal rate and regular rhythm.     Heart sounds: No murmur heard. Pulmonary:     Effort: Pulmonary effort is normal. No respiratory distress.     Breath sounds: Normal breath sounds. No wheezing, rhonchi or rales.  Abdominal:     General: Abdomen is flat. Bowel sounds are normal.  Musculoskeletal:     Cervical back: Normal range of motion and neck supple.  Skin:    General: Skin is warm and dry.     Capillary Refill: Capillary refill takes less than 2 seconds.  Neurological:     General: No focal deficit present.     Mental Status: He is alert and oriented to person, place, and time.  Psychiatric:        Mood and Affect: Mood normal.        Behavior: Behavior normal.        Thought Content: Thought content normal.        Judgment: Judgment normal.    Results for orders placed or performed in visit on 07/22/21  Lipid panel  Result Value Ref Range   Cholesterol, Total 144 100 - 199 mg/dL   Triglycerides 333 (H) 0 - 149 mg/dL   HDL 35 (L) >39 mg/dL  VLDL Cholesterol Cal 52 (H) 5 - 40 mg/dL   LDL Chol Calc (NIH) 57 0 - 99 mg/dL   Chol/HDL Ratio 4.1 0.0 - 5.0 ratio  Comprehensive metabolic panel  Result Value Ref Range   Glucose 107 (H) 70 - 99 mg/dL   BUN 14 6 - 24 mg/dL   Creatinine, Ser 1.42 (H) 0.76 - 1.27 mg/dL   eGFR 61 >59 mL/min/1.73   BUN/Creatinine Ratio 10 9 - 20   Sodium 142 134 - 144 mmol/L   Potassium 4.3 3.5 - 5.2 mmol/L   Chloride 102 96 - 106 mmol/L   CO2 22 20 - 29 mmol/L   Calcium 9.4 8.7 - 10.2 mg/dL   Total Protein 7.3 6.0 - 8.5 g/dL   Albumin 4.7 4.0 - 5.0 g/dL   Globulin, Total 2.6 1.5 - 4.5 g/dL   Albumin/Globulin Ratio 1.8 1.2 - 2.2   Bilirubin Total 0.4 0.0 - 1.2 mg/dL   Alkaline Phosphatase 102 44 - 121 IU/L   AST 20 0 - 40 IU/L   ALT 26 0 - 44 IU/L      Assessment & Plan:    Problem List Items Addressed This Visit      Cardiovascular and Mediastinum   Hypertension - Primary     Other   Morbid obesity ([place])     Follow up plan: No follow-ups on file.

## 2022-01-21 ENCOUNTER — Ambulatory Visit: Payer: 59 | Admitting: Nurse Practitioner

## 2022-01-21 ENCOUNTER — Telehealth (INDEPENDENT_AMBULATORY_CARE_PROVIDER_SITE_OTHER): Payer: 59 | Admitting: Nurse Practitioner

## 2022-01-21 ENCOUNTER — Encounter: Payer: Self-pay | Admitting: Nurse Practitioner

## 2022-01-21 DIAGNOSIS — E7800 Pure hypercholesterolemia, unspecified: Secondary | ICD-10-CM | POA: Diagnosis not present

## 2022-01-21 DIAGNOSIS — I10 Essential (primary) hypertension: Secondary | ICD-10-CM | POA: Diagnosis not present

## 2022-01-21 MED ORDER — AMLODIPINE BESYLATE 10 MG PO TABS: 10.0000 mg | ORAL_TABLET | Freq: Every day | ORAL | 5 refills | Status: DC

## 2022-01-21 MED ORDER — ROSUVASTATIN CALCIUM 5 MG PO TABS: 5.0000 mg | ORAL_TABLET | Freq: Every day | ORAL | 5 refills | Status: DC

## 2022-01-21 MED ORDER — LOSARTAN POTASSIUM 50 MG PO TABS: 75.0000 mg | ORAL_TABLET | Freq: Every day | ORAL | 5 refills | Status: DC

## 2022-01-21 NOTE — Progress Notes (Signed)
There were no vitals taken for this visit.   Subjective:    Patient ID: Alan Barnes, male    DOB: 20-May-1971, 51 y.o.   MRN: [id number]  HPI: Alan Barnes is a 51 y.o. male  Chief Complaint  Patient presents with   Hypertension   Hyperlipidemia   Diabetes    6 month follow up      HYPERTENSION Hypertension status: controlled  Satisfied with current treatment? yes Duration of hypertension: years BP monitoring frequency:  weekly BP range: 118/87 BP medication side effects:  no Medication compliance: excellent compliance Previous BP meds:amlodipine and losartan (cozaar) Aspirin: no Recurrent headaches: no Visual changes: no Palpitations: no Dyspnea: no Chest pain: no Lower extremity edema: no Dizzy/lightheaded: no  Patient states his family members have tested positive for COVID so we are doing a virtual appt.   Relevant past medical, surgical, family and social history reviewed and updated as indicated. Interim medical history since our last visit reviewed. Allergies and medications reviewed and updated.  Review of Systems  Eyes:  Negative for visual disturbance.  Respiratory:  Negative for shortness of breath.   Cardiovascular:  Negative for chest pain and leg swelling.  Neurological:  Negative for light-headedness and headaches.    Per HPI unless specifically indicated above     Objective:    There were no vitals taken for this visit.  Wt Readings from Last 3 Encounters:  07/22/21 (!) 373 lb 9.6 oz (169.5 kg)  02/20/21 (!) 371 lb (168.3 kg)  01/17/21 (!) 367 lb (166.5 kg)    Physical Exam Vitals and nursing note reviewed.  Constitutional:      General: He is not in acute distress.    Appearance: He is not ill-appearing.  HENT:     Head: Normocephalic.     Right Ear: Hearing normal.     Left Ear: Hearing normal.     Nose: Nose normal.  Pulmonary:     Effort: Pulmonary effort is normal. No respiratory distress.  Neurological:     Mental  Status: He is alert.  Psychiatric:        Mood and Affect: Mood normal.        Behavior: Behavior normal.        Thought Content: Thought content normal.        Judgment: Judgment normal.     Results for orders placed or performed in visit on 07/22/21  Lipid panel  Result Value Ref Range   Cholesterol, Total 144 100 - 199 mg/dL   Triglycerides 333 (H) 0 - 149 mg/dL   HDL 35 (L) >39 mg/dL   VLDL Cholesterol Cal 52 (H) 5 - 40 mg/dL   LDL Chol Calc (NIH) 57 0 - 99 mg/dL   Chol/HDL Ratio 4.1 0.0 - 5.0 ratio  Comprehensive metabolic panel  Result Value Ref Range   Glucose 107 (H) 70 - 99 mg/dL   BUN 14 6 - 24 mg/dL   Creatinine, Ser 1.42 (H) 0.76 - 1.27 mg/dL   eGFR 61 >59 mL/min/1.73   BUN/Creatinine Ratio 10 9 - 20   Sodium 142 134 - 144 mmol/L   Potassium 4.3 3.5 - 5.2 mmol/L   Chloride 102 96 - 106 mmol/L   CO2 22 20 - 29 mmol/L   Calcium 9.4 8.7 - 10.2 mg/dL   Total Protein 7.3 6.0 - 8.5 g/dL   Albumin 4.7 4.0 - 5.0 g/dL   Globulin, Total 2.6 1.5 - 4.5 g/dL   Albumin/Globulin Ratio  1.8 1.2 - 2.2   Bilirubin Total 0.4 0.0 - 1.2 mg/dL   Alkaline Phosphatase 102 44 - 121 IU/L   AST 20 0 - 40 IU/L   ALT 26 0 - 44 IU/L      Assessment & Plan:   Problem List Items Addressed This Visit       Cardiovascular and Mediastinum   Hypertension - Primary    Chronic.  Controlled.  Continue with current medication regimen of Losartan and Amlodipine.  Refills sent today.  Labs ordered today.  Return to clinic in 6 months for reevaluation.  Call sooner if concerns arise.        Relevant Medications   amLODipine (NORVASC) 10 MG tablet   losartan (COZAAR) 50 MG tablet   rosuvastatin (CRESTOR) 5 MG tablet   Other Relevant Orders   Comp Met (CMET)     Other   Hypercholesteremia    Chronic.  Controlled.  Continue with current medication regimen of Crestor 23m daily.  Refills sent today.  Labs ordered today.  Return to clinic in 6 months for reevaluation.  Call sooner if concerns  arise.        Relevant Medications   amLODipine (NORVASC) 10 MG tablet   losartan (COZAAR) 50 MG tablet   rosuvastatin (CRESTOR) 5 MG tablet   Other Relevant Orders   Lipid Profile     Follow up plan: Return in about 6 months (around 07/24/2022) for Physical and Fasting labs.   This visit was completed via MyChart due to the restrictions of the COVID-19 pandemic. All issues as above were discussed and addressed. Physical exam was done as above through visual confirmation on MyChart. If it was felt that the patient should be evaluated in the office, they were directed there. The patient verbally consented to this visit. Location of the patient: Home Location of the provider: Office Those involved with this call:  Provider: KJon Billings NP CMA: VValinda Hoar CTrappeDesk/Registration: [facility]Hard[facility] HospitalThis encounter was conducted via video.  I spent 20 dedicated to the care of this patient on the date of this encounter to include previsit review of medications, blood pressures, and labs, face to face time with the patient, and post visit ordering of testing.

## 2022-01-21 NOTE — Assessment & Plan Note (Addendum)
Chronic.  Controlled.  Continue with current medication regimen of Losartan and Amlodipine.  Refills sent today.  Labs ordered today.  Return to clinic in 6 months for reevaluation.  Call sooner if concerns arise.   

## 2022-01-21 NOTE — Assessment & Plan Note (Signed)
Chronic.  Controlled.  Continue with current medication regimen of Crestor 5mg daily.  Refills sent today.  Labs ordered today.  Return to clinic in 6 months for reevaluation.  Call sooner if concerns arise.  ? ?

## 2022-01-22 NOTE — Progress Notes (Signed)
Patient scheduled.

## 2022-07-24 NOTE — Progress Notes (Unsigned)
BP 138/83 (BP Location: Left Arm, Cuff Size: Large)   Pulse 75   Temp 98.1 F (36.7 C) (Oral)   Ht 6' 2.3" (1.887 m)   Wt (!) 381 lb 12.8 oz (173.2 kg)   SpO2 98%   BMI 48.63 kg/m    Subjective:    Patient ID: Alan Barnes, male    DOB: [birth year], 52 y.o.   MRN: [id number]  HPI: Alan Barnes is a 52 y.o. male presenting on 07/25/2022 for comprehensive medical examination. Current medical complaints include:none  He currently lives with: Interim Problems from his last visit: no  HYPERTENSION Hypertension status: controlled  Satisfied with current treatment? no Duration of hypertension: years BP monitoring frequency:  daily BP range: 130/75-85 BP medication side effects:  no Medication compliance: excellent compliance Previous BP meds:amlodipine and losartan (cozaar) Aspirin: no Recurrent headaches: no Visual changes: no Palpitations: no Dyspnea: no Chest pain: no Lower extremity edema: no Dizzy/lightheaded: no  Depression Screen done today and results listed below:     07/25/2022    8:05 AM 01/21/2022    8:18 AM 07/22/2021    3:49 PM 07/19/2020    8:35 AM 10/31/2019    4:08 PM  Depression screen PHQ 2/9  Decreased Interest 0 0 0 0 0  Down, Depressed, Hopeless 0 0 0 0 0  PHQ - 2 Score 0 0 0 0 0  Altered sleeping 0 0 0  0  Tired, decreased energy 0 0 0  0  Change in appetite 0 0 0  0  Feeling bad or failure about yourself  0  0  0  Trouble concentrating 0 0 0  0  Moving slowly or fidgety/restless 0 0 0  0  Suicidal thoughts 0 0 0  0  PHQ-9 Score 0 0 0  0  Difficult doing work/chores Not difficult at all Not difficult at all Not difficult at all      The patient does not have a history of falls. I did complete a risk assessment for falls. A plan of care for falls was documented.   Past Medical History:  Past Medical History:  Diagnosis Date   Gout    Hypertension    Morbid obesity ([place])     Surgical History:  Past Surgical History:  Procedure  Laterality Date   None     WISDOM TOOTH EXTRACTION      Medications:  Current Outpatient Medications on File Prior to Visit  Medication Sig   [DISCONTINUED] fluticasone (FLONASE) 50 MCG/ACT nasal spray Place 2 sprays into both nostrils daily.   No current facility-administered medications on file prior to visit.    Allergies:  Allergies  Allergen Reactions   Penicillins Hives    Social History:  Social History   Socioeconomic History   Marital status: Married    Spouse name: Not on file   Number of children: Not on file   Years of education: Not on file   Highest education level: Not on file  Occupational History   Not on file  Tobacco Use   Smoking status: Never   Smokeless tobacco: Never  Vaping Use   Vaping Use: Never used  Substance and Sexual Activity   Alcohol use: No    Comment: denies   Drug use: No   Sexual activity: Yes  Other Topics Concern   Not on file  Social History Narrative   Not on file   Social Determinants of Health   Financial Resource Strain: Not on  file  Food Insecurity: Not on file  Transportation Needs: Not on file  Physical Activity: Not on file  Stress: Not on file  Social Connections: Not on file  Intimate Partner Violence: Not on file   Social History   Tobacco Use  Smoking Status Never  Smokeless Tobacco Never   Social History   Substance and Sexual Activity  Alcohol Use No   Comment: denies    Family History:  Family History  Problem Relation Age of Onset   Hypertension Mother    Hypertension Father     Past medical history, surgical history, medications, allergies, family history and social history reviewed with patient today and changes made to appropriate areas of the chart.   Review of Systems  Eyes:  Negative for blurred vision and double vision.  Respiratory:  Negative for shortness of breath.   Cardiovascular:  Negative for chest pain, palpitations and leg swelling.  Neurological:  Negative for  dizziness and headaches.   All other ROS negative except what is listed above and in the HPI.      Objective:    BP 138/83 (BP Location: Left Arm, Cuff Size: Large)   Pulse 75   Temp 98.1 F (36.7 C) (Oral)   Ht 6' 2.3" (1.887 m)   Wt (!) 381 lb 12.8 oz (173.2 kg)   SpO2 98%   BMI 48.63 kg/m   Wt Readings from Last 3 Encounters:  07/25/22 (!) 381 lb 12.8 oz (173.2 kg)  07/22/21 (!) 373 lb 9.6 oz (169.5 kg)  02/20/21 (!) 371 lb (168.3 kg)    Physical Exam Vitals and nursing note reviewed.  Constitutional:      General: He is not in acute distress.    Appearance: Normal appearance. He is obese. He is not ill-appearing, toxic-appearing or diaphoretic.  HENT:     Head: Normocephalic.     Right Ear: Tympanic membrane, ear canal and external ear normal.     Left Ear: Tympanic membrane, ear canal and external ear normal.     Nose: Nose normal. No congestion or rhinorrhea.     Mouth/Throat:     Mouth: Mucous membranes are moist.  Eyes:     General:        Right eye: No discharge.        Left eye: No discharge.     Extraocular Movements: Extraocular movements intact.     Conjunctiva/sclera: Conjunctivae normal.     Pupils: Pupils are equal, round, and reactive to light.  Cardiovascular:     Rate and Rhythm: Normal rate and regular rhythm.     Heart sounds: No murmur heard. Pulmonary:     Effort: Pulmonary effort is normal. No respiratory distress.     Breath sounds: Normal breath sounds. No wheezing, rhonchi or rales.  Abdominal:     General: Abdomen is flat. Bowel sounds are normal. There is no distension.     Palpations: Abdomen is soft.     Tenderness: There is no abdominal tenderness. There is no guarding.  Musculoskeletal:     Cervical back: Normal range of motion and neck supple.  Skin:    General: Skin is warm and dry.     Capillary Refill: Capillary refill takes less than 2 seconds.  Neurological:     General: No focal deficit present.     Mental Status: He is  alert and oriented to person, place, and time.     Cranial Nerves: No cranial nerve deficit.  Motor: No weakness.     Deep Tendon Reflexes: Reflexes normal.  Psychiatric:        Mood and Affect: Mood normal.        Behavior: Behavior normal.        Thought Content: Thought content normal.        Judgment: Judgment normal.     Results for orders placed or performed in visit on 07/22/21  Lipid panel  Result Value Ref Range   Cholesterol, Total 144 100 - 199 mg/dL   Triglycerides 284 (H) 0 - 149 mg/dL   HDL 35 (L) >13 mg/dL   VLDL Cholesterol Cal 52 (H) 5 - 40 mg/dL   LDL Chol Calc (NIH) 57 0 - 99 mg/dL   Chol/HDL Ratio 4.1 0.0 - 5.0 ratio  Comprehensive metabolic panel  Result Value Ref Range   Glucose 107 (H) 70 - 99 mg/dL   BUN 14 6 - 24 mg/dL   Creatinine, Ser 2.44 (H) 0.76 - 1.27 mg/dL   eGFR 61 >01 UU/VOZ/3.66   BUN/Creatinine Ratio 10 9 - 20   Sodium 142 134 - 144 mmol/L   Potassium 4.3 3.5 - 5.2 mmol/L   Chloride 102 96 - 106 mmol/L   CO2 22 20 - 29 mmol/L   Calcium 9.4 8.7 - 10.2 mg/dL   Total Protein 7.3 6.0 - 8.5 g/dL   Albumin 4.7 4.0 - 5.0 g/dL   Globulin, Total 2.6 1.5 - 4.5 g/dL   Albumin/Globulin Ratio 1.8 1.2 - 2.2   Bilirubin Total 0.4 0.0 - 1.2 mg/dL   Alkaline Phosphatase 102 44 - 121 IU/L   AST 20 0 - 40 IU/L   ALT 26 0 - 44 IU/L      Assessment & Plan:   Problem List Items Addressed This Visit       Cardiovascular and Mediastinum   Hypertension    Chronic.  Controlled.  Continue with current medication regimen of Losartan 75mg  daily and Amlodipine 10mg  daily.  Refills sent today.  Labs ordered today.  Return to clinic in 6 months for reevaluation.  Call sooner if concerns arise.        Relevant Medications   losartan (COZAAR) 50 MG tablet   amLODipine (NORVASC) 10 MG tablet   rosuvastatin (CRESTOR) 5 MG tablet     Other   Morbid obesity (HCC)    Recommended eating smaller high protein, low fat meals more frequently and exercising 30  mins a day 5 times a week with a goal of 10-15lb weight loss in the next 3 months.       Hypercholesteremia    Chronic.  Controlled.  Continue with current medication regimen of Crestor 5mg  daily.  Refills sent today.  Labs ordered today.  Return to clinic in 6 months for reevaluation.  Call sooner if concerns arise.        Relevant Medications   losartan (COZAAR) 50 MG tablet   amLODipine (NORVASC) 10 MG tablet   rosuvastatin (CRESTOR) 5 MG tablet   Other Relevant Orders   Lipid panel   Other Visit Diagnoses     Annual physical exam    -  Primary   Health maintenance reviewed during visit today. Labs ordered.  Declined flu.  Cologuard ordered.   Relevant Orders   TSH   CBC with Differential/Platelet   Comprehensive metabolic panel   Urinalysis, Routine w reflex microscopic   Hepatitis C Antibody   Encounter for hepatitis C screening test for low risk  patient       Relevant Orders   Hepatitis C Antibody   Screening for colon cancer       Relevant Orders   Cologuard        Discussed aspirin prophylaxis for myocardial infarction prevention and decision was it was not indicated  LABORATORY TESTING:  Health maintenance labs ordered today as discussed above.   The natural history of prostate cancer and ongoing controversy regarding screening and potential treatment outcomes of prostate cancer has been discussed with the patient. The meaning of a false positive PSA and a false negative PSA has been discussed. He indicates understanding of the limitations of this screening test and wishes to proceed with screening PSA testing.   IMMUNIZATIONS:   - Tdap: Tetanus vaccination status reviewed: last tetanus booster within 10 years. - Influenza: Refused - Pneumovax: Not applicable - Prevnar: Not applicable - COVID:  Discussed at visit - HPV: Not applicable - Shingrix vaccine: Not applicable  SCREENING: - Colonoscopy:  Checking with issuance to see if Cologuard is covered    Discussed with patient purpose of the colonoscopy is to detect colon cancer at curable precancerous or early stages   - AAA Screening: Not applicable  -Hearing Test: Not applicable  -Spirometry: Not applicable   PATIENT COUNSELING:    Sexuality: Discussed sexually transmitted diseases, partner selection, use of condoms, avoidance of unintended pregnancy  and contraceptive alternatives.   Advised to avoid cigarette smoking.  I discussed with the patient that most people either abstain from alcohol or drink within safe limits (<=14/week and <=4 drinks/occasion for males, <=7/weeks and <= 3 drinks/occasion for females) and that the risk for alcohol disorders and other health effects rises proportionally with the number of drinks per week and how often a drinker exceeds daily limits.  Discussed cessation/primary prevention of drug use and availability of treatment for abuse.   Diet: Encouraged to adjust caloric intake to maintain  or achieve ideal body weight, to reduce intake of dietary saturated fat and total fat, to limit sodium intake by avoiding high sodium foods and not adding table salt, and to maintain adequate dietary potassium and calcium preferably from fresh fruits, vegetables, and low-fat dairy products.    stressed the importance of regular exercise  Injury prevention: Discussed safety belts, safety helmets, smoke detector, smoking near bedding or upholstery.   Dental health: Discussed importance of regular tooth brushing, flossing, and dental visits.   Follow up plan: NEXT PREVENTATIVE PHYSICAL DUE IN 1 YEAR. Return in about 5 months (around 12/24/2022) for HTN, HLD, DM2 FU.

## 2022-07-25 ENCOUNTER — Ambulatory Visit (INDEPENDENT_AMBULATORY_CARE_PROVIDER_SITE_OTHER): Payer: 59 | Admitting: Nurse Practitioner

## 2022-07-25 ENCOUNTER — Encounter: Payer: Self-pay | Admitting: Nurse Practitioner

## 2022-07-25 VITALS — BP 138/83 | HR 75 | Temp 98.1°F | Ht 74.3 in | Wt 381.8 lb

## 2022-07-25 DIAGNOSIS — Z1159 Encounter for screening for other viral diseases: Secondary | ICD-10-CM

## 2022-07-25 DIAGNOSIS — Z1211 Encounter for screening for malignant neoplasm of colon: Secondary | ICD-10-CM | POA: Diagnosis not present

## 2022-07-25 DIAGNOSIS — I10 Essential (primary) hypertension: Secondary | ICD-10-CM

## 2022-07-25 DIAGNOSIS — E7800 Pure hypercholesterolemia, unspecified: Secondary | ICD-10-CM | POA: Diagnosis not present

## 2022-07-25 DIAGNOSIS — Z0000 Encounter for general adult medical examination without abnormal findings: Secondary | ICD-10-CM

## 2022-07-25 LAB — URINALYSIS, ROUTINE W REFLEX MICROSCOPIC
Bilirubin, UA: NEGATIVE
Glucose, UA: NEGATIVE
Ketones, UA: NEGATIVE
Leukocytes,UA: NEGATIVE
Nitrite, UA: NEGATIVE
Specific Gravity, UA: 1.02 (ref 1.005–1.030)
Urobilinogen, Ur: 0.2 mg/dL (ref 0.2–1.0)
pH, UA: 6.5 (ref 5.0–7.5)

## 2022-07-25 LAB — MICROSCOPIC EXAMINATION: Bacteria, UA: NONE SEEN

## 2022-07-25 MED ORDER — AMLODIPINE BESYLATE 10 MG PO TABS: 10.0000 mg | ORAL_TABLET | Freq: Every day | ORAL | 1 refills | Status: DC

## 2022-07-25 MED ORDER — LOSARTAN POTASSIUM 50 MG PO TABS: 75.0000 mg | ORAL_TABLET | Freq: Every day | ORAL | 1 refills | Status: DC

## 2022-07-25 MED ORDER — ROSUVASTATIN CALCIUM 5 MG PO TABS: 5.0000 mg | ORAL_TABLET | Freq: Every day | ORAL | 1 refills | Status: DC

## 2022-07-25 NOTE — Assessment & Plan Note (Signed)
Recommended eating smaller high protein, low fat meals more frequently and exercising 30 mins a day 5 times a week with a goal of 10-15lb weight loss in the next 3 months.  

## 2022-07-25 NOTE — Assessment & Plan Note (Signed)
Chronic.  Controlled.  Continue with current medication regimen of Crestor 5mg daily.  Refills sent today.  Labs ordered today.  Return to clinic in 6 months for reevaluation.  Call sooner if concerns arise.  ? ?

## 2022-07-25 NOTE — Assessment & Plan Note (Signed)
Chronic.  Controlled.  Continue with current medication regimen of Losartan 75mg  daily and Amlodipine 10mg  daily.  Refills sent today.  Labs ordered today.  Return to clinic in 6 months for reevaluation.  Call sooner if concerns arise.

## 2022-07-26 LAB — CBC WITH DIFFERENTIAL/PLATELET
Basophils Absolute: 0 10*3/uL (ref 0.0–0.2)
Basos: 0 %
EOS (ABSOLUTE): 0.1 10*3/uL (ref 0.0–0.4)
Eos: 1 %
Hematocrit: 44.5 % (ref 37.5–51.0)
Hemoglobin: 15 g/dL (ref 13.0–17.7)
Immature Grans (Abs): 0 10*3/uL (ref 0.0–0.1)
Immature Granulocytes: 0 %
Lymphocytes Absolute: 1.7 10*3/uL (ref 0.7–3.1)
Lymphs: 26 %
MCH: 29.5 pg (ref 26.6–33.0)
MCHC: 33.7 g/dL (ref 31.5–35.7)
MCV: 87 fL (ref 79–97)
Monocytes Absolute: 0.6 10*3/uL (ref 0.1–0.9)
Monocytes: 9 %
Neutrophils Absolute: 4 10*3/uL (ref 1.4–7.0)
Neutrophils: 64 %
Platelets: 307 10*3/uL (ref 150–450)
RBC: 5.09 x10E6/uL (ref 4.14–5.80)
RDW: 12.1 % (ref 11.6–15.4)
WBC: 6.3 10*3/uL (ref 3.4–10.8)

## 2022-07-26 LAB — LIPID PANEL
Chol/HDL Ratio: 2.8 ratio (ref 0.0–5.0)
Cholesterol, Total: 106 mg/dL (ref 100–199)
HDL: 38 mg/dL — ABNORMAL LOW (ref >39)
LDL Chol Calc (NIH): 46 mg/dL (ref 0–99)
Triglycerides: 122 mg/dL (ref 0–149)
VLDL Cholesterol Cal: 22 mg/dL (ref 5–40)

## 2022-07-26 LAB — TSH: TSH: 0.945 u[IU]/mL (ref 0.450–4.500)

## 2022-07-26 LAB — COMPREHENSIVE METABOLIC PANEL
ALT: 29 IU/L (ref 0–44)
AST: 20 IU/L (ref 0–40)
Albumin/Globulin Ratio: 2.1 (ref 1.2–2.2)
Albumin: 4.8 g/dL (ref 4.1–5.1)
Alkaline Phosphatase: 89 IU/L (ref 44–121)
BUN/Creatinine Ratio: 11 (ref 9–20)
BUN: 14 mg/dL (ref 6–24)
Bilirubin Total: 0.5 mg/dL (ref 0.0–1.2)
CO2: 25 mmol/L (ref 20–29)
Calcium: 9.7 mg/dL (ref 8.7–10.2)
Chloride: 100 mmol/L (ref 96–106)
Creatinine, Ser: 1.31 mg/dL — ABNORMAL HIGH (ref 0.76–1.27)
Globulin, Total: 2.3 g/dL (ref 1.5–4.5)
Glucose: 98 mg/dL (ref 70–99)
Potassium: 4.3 mmol/L (ref 3.5–5.2)
Sodium: 145 mmol/L — ABNORMAL HIGH (ref 134–144)
Total Protein: 7.1 g/dL (ref 6.0–8.5)
eGFR: 67 mL/min/{1.73_m2} (ref >59)

## 2022-07-26 LAB — HEPATITIS C ANTIBODY: Hep C Virus Ab: NONREACTIVE

## 2022-07-28 NOTE — Progress Notes (Signed)
HI Alan Barnes. It was nice to see you last week.  Your lab work looks good.  Kidney function remains stable.  Cholesterol improved from prior.  Keep up the good work.  Your sodium was 1 point over normal.  I do not think it is anything to worry about at this time.  No other concerns at this time. Continue with your current medication regimen.  Follow up as discussed.  Please let me know if you have any questions.

## 2022-08-05 DIAGNOSIS — Z1231 Encounter for screening mammogram for malignant neoplasm of breast: Secondary | ICD-10-CM | POA: Diagnosis not present

## 2022-08-06 DIAGNOSIS — R0982 Postnasal drip: Secondary | ICD-10-CM | POA: Diagnosis not present

## 2022-08-06 DIAGNOSIS — K219 Gastro-esophageal reflux disease without esophagitis: Secondary | ICD-10-CM | POA: Diagnosis not present

## 2022-08-06 DIAGNOSIS — J309 Allergic rhinitis, unspecified: Secondary | ICD-10-CM | POA: Diagnosis not present

## 2022-08-13 DIAGNOSIS — Z1211 Encounter for screening for malignant neoplasm of colon: Secondary | ICD-10-CM | POA: Diagnosis not present

## 2022-08-23 LAB — COLOGUARD: COLOGUARD: NEGATIVE

## 2022-08-25 NOTE — Progress Notes (Signed)
Hi Alan Barnes.  Your cologuard was negative.  We will repeat it in 3 years.

## 2022-12-24 ENCOUNTER — Ambulatory Visit (INDEPENDENT_AMBULATORY_CARE_PROVIDER_SITE_OTHER): Payer: 59 | Admitting: Nurse Practitioner

## 2022-12-24 ENCOUNTER — Encounter: Payer: Self-pay | Admitting: Nurse Practitioner

## 2022-12-24 VITALS — BP 167/96 | HR 80 | Temp 98.6°F | Wt 373.6 lb

## 2022-12-24 DIAGNOSIS — I10 Essential (primary) hypertension: Secondary | ICD-10-CM

## 2022-12-24 DIAGNOSIS — N1831 Chronic kidney disease, stage 3a: Secondary | ICD-10-CM | POA: Insufficient documentation

## 2022-12-24 DIAGNOSIS — E7800 Pure hypercholesterolemia, unspecified: Secondary | ICD-10-CM

## 2022-12-24 MED ORDER — ROSUVASTATIN CALCIUM 5 MG PO TABS: 5.0000 mg | ORAL_TABLET | Freq: Every day | ORAL | 1 refills | Status: DC

## 2022-12-24 MED ORDER — AMLODIPINE BESYLATE 10 MG PO TABS: 10.0000 mg | ORAL_TABLET | Freq: Every day | ORAL | 1 refills | Status: DC

## 2022-12-24 MED ORDER — LOSARTAN POTASSIUM 100 MG PO TABS: 100.0000 mg | ORAL_TABLET | Freq: Every day | ORAL | 1 refills | Status: DC

## 2022-12-24 NOTE — Assessment & Plan Note (Signed)
Recommended eating smaller high protein, low fat meals more frequently and exercising 30 mins a day 5 times a week with a goal of 10-15lb weight loss in the next 3 months.  

## 2022-12-24 NOTE — Progress Notes (Signed)
BP (!) 167/96   Pulse 80   Temp 98.6 F (37 C) (Oral)   Wt (!) 373 lb 9.6 oz (169.5 kg)   SpO2 97%   BMI 47.58 kg/m    Subjective:    Patient ID: Alan Barnes, male    DOB: 06/28/1971, 52 y.o.   MRN: [id number]  HPI: Alan Barnes is a 52 y.o. male  Chief Complaint  Patient presents with   Hypertension   HYPERTENSION / HYPERLIPIDEMIA Satisfied with current treatment? yes Duration of hypertension: years BP monitoring frequency: weekly BP range: 130-140/70 BP medication side effects: no Past BP meds: amlodipine and losartan Duration of hyperlipidemia: years Cholesterol medication side effects: no Cholesterol supplements: none Past cholesterol medications: rosuvastatin (crestor) Medication compliance: excellent compliance Aspirin: no Recent stressors: no Recurrent headaches: no Visual changes: no Palpitations: no Dyspnea: no Chest pain: no Lower extremity edema: no Dizzy/lightheaded: no  CHRONIC KIDNEY DISEASE CKD status: controlled Medications renally dose: yes Previous renal evaluation: no Pneumovax:  Up to Date Influenza Vaccine:  Up to Date   Relevant past medical, surgical, family and social history reviewed and updated as indicated. Interim medical history since our last visit reviewed. Allergies and medications reviewed and updated.  Review of Systems  Eyes:  Negative for visual disturbance.  Respiratory:  Negative for shortness of breath.   Cardiovascular:  Negative for chest pain and leg swelling.  Neurological:  Negative for light-headedness and headaches.    Per HPI unless specifically indicated above     Objective:    BP (!) 167/96   Pulse 80   Temp 98.6 F (37 C) (Oral)   Wt (!) 373 lb 9.6 oz (169.5 kg)   SpO2 97%   BMI 47.58 kg/m   Wt Readings from Last 3 Encounters:  12/24/22 (!) 373 lb 9.6 oz (169.5 kg)  07/25/22 (!) 381 lb 12.8 oz (173.2 kg)  07/22/21 (!) 373 lb 9.6 oz (169.5 kg)    Physical Exam Vitals and nursing note  reviewed.  Constitutional:      General: He is not in acute distress.    Appearance: Normal appearance. He is obese. He is not ill-appearing, toxic-appearing or diaphoretic.  HENT:     Head: Normocephalic.     Right Ear: External ear normal.     Left Ear: External ear normal.     Nose: Nose normal. No congestion or rhinorrhea.     Mouth/Throat:     Mouth: Mucous membranes are moist.  Eyes:     General:        Right eye: No discharge.        Left eye: No discharge.     Extraocular Movements: Extraocular movements intact.     Conjunctiva/sclera: Conjunctivae normal.     Pupils: Pupils are equal, round, and reactive to light.  Cardiovascular:     Rate and Rhythm: Normal rate and regular rhythm.     Heart sounds: No murmur heard. Pulmonary:     Effort: Pulmonary effort is normal. No respiratory distress.     Breath sounds: Normal breath sounds. No wheezing, rhonchi or rales.  Abdominal:     General: Abdomen is flat. Bowel sounds are normal.  Musculoskeletal:     Cervical back: Normal range of motion and neck supple.  Skin:    General: Skin is warm and dry.     Capillary Refill: Capillary refill takes less than 2 seconds.  Neurological:     General: No focal deficit present.  Mental Status: He is alert and oriented to person, place, and time.  Psychiatric:        Mood and Affect: Mood normal.        Behavior: Behavior normal.        Thought Content: Thought content normal.        Judgment: Judgment normal.     Results for orders placed or performed in visit on 07/25/22  Microscopic Examination   Urine  Result Value Ref Range   WBC, UA 0-5 0 - 5 /hpf   RBC, Urine 0-2 0 - 2 /hpf   Epithelial Cells (non renal) 0-10 0 - 10 /hpf   Bacteria, UA None seen None seen/Few  TSH  Result Value Ref Range   TSH 0.945 0.450 - 4.500 uIU/mL  Lipid panel  Result Value Ref Range   Cholesterol, Total 106 100 - 199 mg/dL   Triglycerides 161 0 - 149 mg/dL   HDL 38 (L) >09 mg/dL    VLDL Cholesterol Cal 22 5 - 40 mg/dL   LDL Chol Calc (NIH) 46 0 - 99 mg/dL   Chol/HDL Ratio 2.8 0.0 - 5.0 ratio  CBC with Differential/Platelet  Result Value Ref Range   WBC 6.3 3.4 - 10.8 x10E3/uL   RBC 5.09 4.14 - 5.80 x10E6/uL   Hemoglobin 15.0 13.0 - 17.7 g/dL   Hematocrit 60.4 54.0 - 51.0 %   MCV 87 79 - 97 fL   MCH 29.5 26.6 - 33.0 pg   MCHC 33.7 31.5 - 35.7 g/dL   RDW 98.1 19.1 - 47.8 %   Platelets 307 150 - 450 x10E3/uL   Neutrophils 64 Not Estab. %   Lymphs 26 Not Estab. %   Monocytes 9 Not Estab. %   Eos 1 Not Estab. %   Basos 0 Not Estab. %   Neutrophils Absolute 4.0 1.4 - 7.0 x10E3/uL   Lymphocytes Absolute 1.7 0.7 - 3.1 x10E3/uL   Monocytes Absolute 0.6 0.1 - 0.9 x10E3/uL   EOS (ABSOLUTE) 0.1 0.0 - 0.4 x10E3/uL   Basophils Absolute 0.0 0.0 - 0.2 x10E3/uL   Immature Granulocytes 0 Not Estab. %   Immature Grans (Abs) 0.0 0.0 - 0.1 x10E3/uL  Comprehensive metabolic panel  Result Value Ref Range   Glucose 98 70 - 99 mg/dL   BUN 14 6 - 24 mg/dL   Creatinine, Ser 2.95 (H) 0.76 - 1.27 mg/dL   eGFR 67 >62 ZH/YQM/5.78   BUN/Creatinine Ratio 11 9 - 20   Sodium 145 (H) 134 - 144 mmol/L   Potassium 4.3 3.5 - 5.2 mmol/L   Chloride 100 96 - 106 mmol/L   CO2 25 20 - 29 mmol/L   Calcium 9.7 8.7 - 10.2 mg/dL   Total Protein 7.1 6.0 - 8.5 g/dL   Albumin 4.8 4.1 - 5.1 g/dL   Globulin, Total 2.3 1.5 - 4.5 g/dL   Albumin/Globulin Ratio 2.1 1.2 - 2.2   Bilirubin Total 0.5 0.0 - 1.2 mg/dL   Alkaline Phosphatase 89 44 - 121 IU/L   AST 20 0 - 40 IU/L   ALT 29 0 - 44 IU/L  Urinalysis, Routine w reflex microscopic  Result Value Ref Range   Specific Gravity, UA 1.020 1.005 - 1.030   pH, UA 6.5 5.0 - 7.5   Color, UA Yellow Yellow   Appearance Ur Clear Clear   Leukocytes,UA Negative Negative   Protein,UA Trace (A) Negative/Trace   Glucose, UA Negative Negative   Ketones, UA Negative Negative  RBC, UA Trace (A) Negative   Bilirubin, UA Negative Negative   Urobilinogen, Ur 0.2  0.2 - 1.0 mg/dL   Nitrite, UA Negative Negative   Microscopic Examination See below:   Hepatitis C Antibody  Result Value Ref Range   Hep C Virus Ab Non Reactive Non Reactive  Cologuard  Result Value Ref Range   COLOGUARD Negative Negative      Assessment & Plan:   Problem List Items Addressed This Visit       Cardiovascular and Mediastinum   Hypertension    Chronic. Not well controlled.  Will increase Losartan to 100mg  daily.  Continue with Amlodipine.  Labs ordered today.  Continue to check blood pressure at home.  Follow up in 1 month.  Call sooner if concerns arise.       Relevant Medications   losartan (COZAAR) 100 MG tablet   rosuvastatin (CRESTOR) 5 MG tablet   amLODipine (NORVASC) 10 MG tablet   Other Relevant Orders   Comp Met (CMET)     Genitourinary   Stage 3a chronic kidney disease (HCC) - Primary    Chronic.  Need to work to get blood pressure better controlled.  Discussed with patient that ideally he would be in the 120s.  Labs ordered.  Follow up in 6 months.  Can consider SGLT2 in the future.       Relevant Orders   CBC w/Diff     Other   Morbid obesity (HCC)    Recommended eating smaller high protein, low fat meals more frequently and exercising 30 mins a day 5 times a week with a goal of 10-15lb weight loss in the next 3 months.      Hypercholesteremia    Chronic.  Controlled.  Continue with current medication regimen.  Refills sent today.  Labs ordered today.  Return to clinic in 6 months for reevaluation.  Call sooner if concerns arise.        Relevant Medications   losartan (COZAAR) 100 MG tablet   rosuvastatin (CRESTOR) 5 MG tablet   amLODipine (NORVASC) 10 MG tablet   Other Relevant Orders   Lipid Profile     Follow up plan: Return in about 1 month (around 01/23/2023) for BP Check.

## 2022-12-24 NOTE — Assessment & Plan Note (Signed)
Chronic.  Controlled.  Continue with current medication regimen.  Refills sent today.  Labs ordered today.  Return to clinic in 6 months for reevaluation.  Call sooner if concerns arise.  ° °

## 2022-12-24 NOTE — Assessment & Plan Note (Signed)
Chronic. Not well controlled.  Will increase Losartan to 100mg  daily.  Continue with Amlodipine.  Labs ordered today.  Continue to check blood pressure at home.  Follow up in 1 month.  Call sooner if concerns arise.

## 2022-12-24 NOTE — Assessment & Plan Note (Signed)
Chronic.  Need to work to get blood pressure better controlled.  Discussed with patient that ideally he would be in the 120s.  Labs ordered.  Follow up in 6 months.  Can consider SGLT2 in the future.

## 2022-12-25 LAB — COMPREHENSIVE METABOLIC PANEL
ALT: 28 IU/L (ref 0–44)
AST: 20 IU/L (ref 0–40)
Albumin/Globulin Ratio: 2
Albumin: 4.8 g/dL (ref 4.1–5.1)
Alkaline Phosphatase: 93 IU/L (ref 44–121)
BUN/Creatinine Ratio: 10 (ref 9–20)
BUN: 14 mg/dL (ref 6–24)
Bilirubin Total: 0.7 mg/dL (ref 0.0–1.2)
CO2: 23 mmol/L (ref 20–29)
Calcium: 10 mg/dL (ref 8.7–10.2)
Chloride: 104 mmol/L (ref 96–106)
Creatinine, Ser: 1.39 mg/dL — ABNORMAL HIGH (ref 0.76–1.27)
Globulin, Total: 2.4 g/dL (ref 1.5–4.5)
Glucose: 98 mg/dL (ref 70–99)
Potassium: 4.5 mmol/L (ref 3.5–5.2)
Sodium: 142 mmol/L (ref 134–144)
Total Protein: 7.2 g/dL (ref 6.0–8.5)
eGFR: 62 mL/min/{1.73_m2} (ref >59)

## 2022-12-25 LAB — CBC WITH DIFFERENTIAL/PLATELET
Basophils Absolute: 0 10*3/uL (ref 0.0–0.2)
Basos: 1 %
EOS (ABSOLUTE): 0.1 10*3/uL (ref 0.0–0.4)
Eos: 1 %
Hematocrit: 47.9 % (ref 37.5–51.0)
Hemoglobin: 15.9 g/dL (ref 13.0–17.7)
Immature Grans (Abs): 0 10*3/uL (ref 0.0–0.1)
Immature Granulocytes: 0 %
Lymphocytes Absolute: 1.9 10*3/uL (ref 0.7–3.1)
Lymphs: 29 %
MCH: 29.8 pg (ref 26.6–33.0)
MCHC: 33.2 g/dL (ref 31.5–35.7)
MCV: 90 fL (ref 79–97)
Monocytes Absolute: 0.5 10*3/uL (ref 0.1–0.9)
Monocytes: 8 %
Neutrophils Absolute: 3.8 10*3/uL (ref 1.4–7.0)
Neutrophils: 61 %
Platelets: 248 10*3/uL (ref 150–450)
RBC: 5.34 x10E6/uL (ref 4.14–5.80)
RDW: 12.6 % (ref 11.6–15.4)
WBC: 6.3 10*3/uL (ref 3.4–10.8)

## 2022-12-25 LAB — LIPID PANEL
Chol/HDL Ratio: 3.2 ratio (ref 0.0–5.0)
Cholesterol, Total: 113 mg/dL (ref 100–199)
HDL: 35 mg/dL — ABNORMAL LOW (ref >39)
LDL Chol Calc (NIH): 52 mg/dL (ref 0–99)
Triglycerides: 153 mg/dL — ABNORMAL HIGH (ref 0–149)
VLDL Cholesterol Cal: 26 mg/dL (ref 5–40)

## 2022-12-25 NOTE — Progress Notes (Signed)
HI Alan Barnes. It was nice to see you yesterday.  Your lab work looks good.  Your kidney function remains stable.  We will continue to work on getting your blood pressure better controlled so we can keep your kidney function where it is.  No concerns at this time. Continue with your current medication regimen.  Follow up as discussed.  Please let me know if you have any questions.

## 2023-01-23 ENCOUNTER — Ambulatory Visit: Payer: 59 | Admitting: Physician Assistant

## 2023-08-10 ENCOUNTER — Telehealth: Payer: Self-pay

## 2023-08-10 NOTE — Telephone Encounter (Signed)
PA for Rosuvastatin initiated and submitted via Cover My Meds. Key: [id number]

## 2023-08-18 NOTE — Telephone Encounter (Signed)
 PA approved.

## 2023-11-03 ENCOUNTER — Other Ambulatory Visit: Payer: Self-pay | Admitting: Nurse Practitioner

## 2023-11-04 ENCOUNTER — Encounter: Payer: Self-pay | Admitting: Nurse Practitioner

## 2023-11-04 ENCOUNTER — Ambulatory Visit: Payer: Self-pay | Admitting: Nurse Practitioner

## 2023-11-04 VITALS — BP 178/84 | HR 92 | Temp 98.1°F | Wt 385.6 lb

## 2023-11-04 DIAGNOSIS — N1831 Chronic kidney disease, stage 3a: Secondary | ICD-10-CM

## 2023-11-04 DIAGNOSIS — E7800 Pure hypercholesterolemia, unspecified: Secondary | ICD-10-CM

## 2023-11-04 DIAGNOSIS — Z0000 Encounter for general adult medical examination without abnormal findings: Secondary | ICD-10-CM | POA: Diagnosis not present

## 2023-11-04 DIAGNOSIS — I10 Essential (primary) hypertension: Secondary | ICD-10-CM | POA: Diagnosis not present

## 2023-11-04 MED ORDER — ROSUVASTATIN CALCIUM 10 MG PO TABS: 10.0000 mg | ORAL_TABLET | Freq: Every day | ORAL | 1 refills | Status: DC

## 2023-11-04 MED ORDER — OLMESARTAN MEDOXOMIL 40 MG PO TABS: 40.0000 mg | ORAL_TABLET | Freq: Every day | ORAL | 0 refills | Status: DC

## 2023-11-04 MED ORDER — AMLODIPINE BESYLATE 10 MG PO TABS: 10.0000 mg | ORAL_TABLET | Freq: Every day | ORAL | 1 refills | Status: DC

## 2023-11-04 NOTE — Telephone Encounter (Signed)
 Requested medication (s) are due for refill today:   Yes  Requested medication (s) are on the active medication list:   Yes  Future visit scheduled:   Yes today (4/23) at 4:00 with Mariah Shines    LOV 12/24/2022   Last ordered: 12/24/2022 #90, 1 refill  Pt. Has appt today with Mariah Shines.   Labs will be due in June.   Mariah Shines to review for labs and refills during visit today.   Requested Prescriptions  Pending Prescriptions Disp Refills   rosuvastatin  (CRESTOR ) 5 MG tablet [Pharmacy Med Name: Rosuvastatin  Calcium  5 MG Oral Tablet] 90 tablet 0    Sig: Take 1 tablet by mouth once daily     Cardiovascular:  Antilipid - Statins 2 Failed - 11/04/2023  8:34 AM      Failed - Cr in normal range and within 360 days    Creatinine, Ser  Date Value Ref Range Status  12/24/2022 1.39 (H) 0.76 - 1.27 mg/dL Final         Failed - Valid encounter within last 12 months    Recent Outpatient Visits   None            Failed - Lipid Panel in normal range within the last 12 months    Cholesterol, Total  Date Value Ref Range Status  12/24/2022 113 100 - 199 mg/dL Final   LDL Chol Calc (NIH)  Date Value Ref Range Status  12/24/2022 52 0 - 99 mg/dL Final   HDL  Date Value Ref Range Status  12/24/2022 35 (L) >39 mg/dL Final   Triglycerides  Date Value Ref Range Status  12/24/2022 153 (H) 0 - 149 mg/dL Final         Passed - Patient is not pregnant

## 2023-11-04 NOTE — Progress Notes (Unsigned)
 BP (!) 178/84   Pulse 92   Temp 98.1 F (36.7 C) (Oral)   Wt (!) 385 lb 9.6 oz (174.9 kg)   SpO2 98%   BMI 49.11 kg/m    Subjective:    Patient ID: Alan Barnes, male    DOB: Mar 27, 1971, 53 y.o.   MRN: [id number]  HPI: Alan Barnes is a 53 y.o. male presenting on 11/04/2023 for comprehensive medical examination. Current medical complaints include:none  He currently lives with: Interim Problems from his last visit: no  HYPERTENSION Hypertension status: controlled  Satisfied with current treatment? no Duration of hypertension: years BP monitoring frequency:  daily BP range: 130-160/80 BP medication side effects:  no Medication compliance: excellent compliance Previous BP meds:amlodipine  and losartan  (cozaar ) Aspirin: no Recurrent headaches: no Visual changes: no Palpitations: no Dyspnea: no Chest pain: no Lower extremity edema: no Dizzy/lightheaded: no   Patient has a recent nose bleed several times over the last week.  Seems to have resolved.    Depression Screen done today and results listed below:     11/04/2023    4:01 PM 12/24/2022    8:15 AM 07/25/2022    8:05 AM 01/21/2022    8:18 AM 07/22/2021    3:49 PM  Depression screen PHQ 2/9  Decreased Interest 0 0 0 0 0  Down, Depressed, Hopeless 0 0 0 0 0  PHQ - 2 Score 0 0 0 0 0  Altered sleeping 0 0 0 0 0  Tired, decreased energy 0 0 0 0 0  Change in appetite 0 0 0 0 0  Feeling bad or failure about yourself  0 0 0  0  Trouble concentrating 0 0 0 0 0  Moving slowly or fidgety/restless 0 0 0 0 0  Suicidal thoughts 0 0 0 0 0  PHQ-9 Score 0 0 0 0 0  Difficult doing work/chores Not difficult at all Not difficult at all Not difficult at all Not difficult at all Not difficult at all    The patient does not have a history of falls. I did complete a risk assessment for falls. A plan of care for falls was documented.   Past Medical History:  Past Medical History:  Diagnosis Date   Gout    Hypertension     Morbid obesity (HCC)     Surgical History:  Past Surgical History:  Procedure Laterality Date   None     WISDOM TOOTH EXTRACTION      Medications:  Current Outpatient Medications on File Prior to Visit  Medication Sig   amLODipine  (NORVASC ) 10 MG tablet Take 1 tablet (10 mg total) by mouth daily.   losartan  (COZAAR ) 100 MG tablet Take 1 tablet (100 mg total) by mouth daily.   rosuvastatin  (CRESTOR ) 5 MG tablet Take 1 tablet (5 mg total) by mouth daily.   [DISCONTINUED] fluticasone  (FLONASE ) 50 MCG/ACT nasal spray Place 2 sprays into both nostrils daily.   No current facility-administered medications on file prior to visit.    Allergies:  Allergies  Allergen Reactions   Penicillins Hives    Social History:  Social History   Socioeconomic History   Marital status: Married    Spouse name: Not on file   Number of children: Not on file   Years of education: Not on file   Highest education level: Master's degree (e.g., MA, MS, MEng, MEd, MSW, MBA)  Occupational History   Not on file  Tobacco Use   Smoking status: Never  Smokeless tobacco: Never  Vaping Use   Vaping status: Never Used  Substance and Sexual Activity   Alcohol use: No    Comment: denies   Drug use: No   Sexual activity: Yes  Other Topics Concern   Not on file  Social History Narrative   Not on file   Social Drivers of Health   Financial Resource Strain: Low Risk  (10/29/2023)   Overall Financial Resource Strain (CARDIA)    Difficulty of Paying Living Expenses: Not hard at all  Food Insecurity: No Food Insecurity (10/29/2023)   Hunger Vital Sign    Worried About Running Out of Food in the Last Year: Never true    Ran Out of Food in the Last Year: Never true  Transportation Needs: No Transportation Needs (10/29/2023)   PRAPARE - Administrator, Civil Service (Medical): No    Lack of Transportation (Non-Medical): No  Physical Activity: Insufficiently Active (10/29/2023)   Exercise Vital  Sign    Days of Exercise per Week: 3 days    Minutes of Exercise per Session: 30 min  Stress: No Stress Concern Present (10/29/2023)   [employer] of Occupational Health - Occupational Stress Questionnaire    Feeling of Stress : Only a little  Social Connections: Socially Integrated (10/29/2023)   Social Connection and Isolation Panel [NHANES]    Frequency of Communication with Friends and Family: More than three times a week    Frequency of Social Gatherings with Friends and Family: Twice a week    Attends Religious Services: More than 4 times per year    Active Member of [employer] or Organizations: Yes    Attends Engineer, structural: More than 4 times per year    Marital Status: Married  Catering manager Violence: Not on file   Social History   Tobacco Use  Smoking Status Never  Smokeless Tobacco Never   Social History   Substance and Sexual Activity  Alcohol Use No   Comment: denies    Family History:  Family History  Problem Relation Age of Onset   Hypertension Mother    Hypertension Father     Past medical history, surgical history, medications, allergies, family history and social history reviewed with patient today and changes made to appropriate areas of the chart.   Review of Systems  Eyes:  Negative for blurred vision and double vision.  Respiratory:  Negative for shortness of breath.   Cardiovascular:  Negative for chest pain, palpitations and leg swelling.  Neurological:  Negative for dizziness and headaches.   All other ROS negative except what is listed above and in the HPI.      Objective:    BP (!) 178/84   Pulse 92   Temp 98.1 F (36.7 C) (Oral)   Wt (!) 385 lb 9.6 oz (174.9 kg)   SpO2 98%   BMI 49.11 kg/m   Wt Readings from Last 3 Encounters:  11/04/23 (!) 385 lb 9.6 oz (174.9 kg)  12/24/22 (!) 373 lb 9.6 oz (169.5 kg)  07/25/22 (!) 381 lb 12.8 oz (173.2 kg)    Physical Exam Vitals and nursing note reviewed.  Constitutional:       General: He is not in acute distress.    Appearance: Normal appearance. He is obese. He is not ill-appearing, toxic-appearing or diaphoretic.  HENT:     Head: Normocephalic.     Right Ear: Tympanic membrane, ear canal and external ear normal.     Left  Ear: Tympanic membrane, ear canal and external ear normal.     Nose: Nose normal. No congestion or rhinorrhea.     Mouth/Throat:     Mouth: Mucous membranes are moist.  Eyes:     General:        Right eye: No discharge.        Left eye: No discharge.     Extraocular Movements: Extraocular movements intact.     Conjunctiva/sclera: Conjunctivae normal.     Pupils: Pupils are equal, round, and reactive to light.  Cardiovascular:     Rate and Rhythm: Normal rate and regular rhythm.     Heart sounds: No murmur heard. Pulmonary:     Effort: Pulmonary effort is normal. No respiratory distress.     Breath sounds: Normal breath sounds. No wheezing, rhonchi or rales.  Abdominal:     General: Abdomen is flat. Bowel sounds are normal. There is no distension.     Palpations: Abdomen is soft.     Tenderness: There is no abdominal tenderness. There is no guarding.  Musculoskeletal:     Cervical back: Normal range of motion and neck supple.  Skin:    General: Skin is warm and dry.     Capillary Refill: Capillary refill takes less than 2 seconds.  Neurological:     General: No focal deficit present.     Mental Status: He is alert and oriented to person, place, and time.     Cranial Nerves: No cranial nerve deficit.     Motor: No weakness.     Deep Tendon Reflexes: Reflexes normal.  Psychiatric:        Mood and Affect: Mood normal.        Behavior: Behavior normal.        Thought Content: Thought content normal.        Judgment: Judgment normal.     Results for orders placed or performed in visit on 12/24/22  Comp Met (CMET)   Collection Time: 12/24/22  8:32 AM  Result Value Ref Range   Glucose 98 70 - 99 mg/dL   BUN 14 6 - 24 mg/dL    Creatinine, Ser 1.61 (H) 0.76 - 1.27 mg/dL   eGFR 62 >09 UE/AVW/0.98   BUN/Creatinine Ratio 10 9 - 20   Sodium 142 134 - 144 mmol/L   Potassium 4.5 3.5 - 5.2 mmol/L   Chloride 104 96 - 106 mmol/L   CO2 23 20 - 29 mmol/L   Calcium  10.0 8.7 - 10.2 mg/dL   Total Protein 7.2 6.0 - 8.5 g/dL   Albumin 4.8 4.1 - 5.1 g/dL   Globulin, Total 2.4 1.5 - 4.5 g/dL   Albumin/Globulin Ratio 2.0    Bilirubin Total 0.7 0.0 - 1.2 mg/dL   Alkaline Phosphatase 93 44 - 121 IU/L   AST 20 0 - 40 IU/L   ALT 28 0 - 44 IU/L  Lipid Profile   Collection Time: 12/24/22  8:32 AM  Result Value Ref Range   Cholesterol, Total 113 100 - 199 mg/dL   Triglycerides 119 (H) 0 - 149 mg/dL   HDL 35 (L) >14 mg/dL   VLDL Cholesterol Cal 26 5 - 40 mg/dL   LDL Chol Calc (NIH) 52 0 - 99 mg/dL   Chol/HDL Ratio 3.2 0.0 - 5.0 ratio  CBC w/Diff   Collection Time: 12/24/22  8:32 AM  Result Value Ref Range   WBC 6.3 3.4 - 10.8 x10E3/uL   RBC 5.34 4.14 - 5.80  x10E6/uL   Hemoglobin 15.9 13.0 - 17.7 g/dL   Hematocrit 47.8 29.5 - 51.0 %   MCV 90 79 - 97 fL   MCH 29.8 26.6 - 33.0 pg   MCHC 33.2 31.5 - 35.7 g/dL   RDW 62.1 30.8 - 65.7 %   Platelets 248 150 - 450 x10E3/uL   Neutrophils 61 Not Estab. %   Lymphs 29 Not Estab. %   Monocytes 8 Not Estab. %   Eos 1 Not Estab. %   Basos 1 Not Estab. %   Neutrophils Absolute 3.8 1.4 - 7.0 x10E3/uL   Lymphocytes Absolute 1.9 0.7 - 3.1 x10E3/uL   Monocytes Absolute 0.5 0.1 - 0.9 x10E3/uL   EOS (ABSOLUTE) 0.1 0.0 - 0.4 x10E3/uL   Basophils Absolute 0.0 0.0 - 0.2 x10E3/uL   Immature Granulocytes 0 Not Estab. %   Immature Grans (Abs) 0.0 0.0 - 0.1 x10E3/uL      Assessment & Plan:   Problem List Items Addressed This Visit       Cardiovascular and Mediastinum   Hypertension     Genitourinary   Stage 3a chronic kidney disease (HCC)     Other   Morbid obesity (HCC)   Hypercholesteremia   Other Visit Diagnoses       Annual physical exam    -  Primary          Discussed aspirin prophylaxis for myocardial infarction prevention and decision was it was not indicated  LABORATORY TESTING:  Health maintenance labs ordered today as discussed above.   The natural history of prostate cancer and ongoing controversy regarding screening and potential treatment outcomes of prostate cancer has been discussed with the patient. The meaning of a false positive PSA and a false negative PSA has been discussed. He indicates understanding of the limitations of this screening test and wishes to proceed with screening PSA testing.   IMMUNIZATIONS:   - Tdap: Tetanus vaccination status reviewed: last tetanus booster within 10 years. - Influenza: Refused - Pneumovax: Declined - Prevnar: Not applicable - COVID:  Discussed at visit - HPV: Not applicable - Shingrix vaccine:Declined  SCREENING: - Colonoscopy: up to date Discussed with patient purpose of the colonoscopy is to detect colon cancer at curable precancerous or early stages   - AAA Screening: Not applicable  -Hearing Test: Not applicable  -Spirometry: Not applicable   PATIENT COUNSELING:    Sexuality: Discussed sexually transmitted diseases, partner selection, use of condoms, avoidance of unintended pregnancy  and contraceptive alternatives.   Advised to avoid cigarette smoking.  I discussed with the patient that most people either abstain from alcohol or drink within safe limits (<=14/week and <=4 drinks/occasion for males, <=7/weeks and <= 3 drinks/occasion for females) and that the risk for alcohol disorders and other health effects rises proportionally with the number of drinks per week and how often a drinker exceeds daily limits.  Discussed cessation/primary prevention of drug use and availability of treatment for abuse.   Diet: Encouraged to adjust caloric intake to maintain  or achieve ideal body weight, to reduce intake of dietary saturated fat and total fat, to limit sodium intake by avoiding  high sodium foods and not adding table salt, and to maintain adequate dietary potassium and calcium  preferably from fresh fruits, vegetables, and low-fat dairy products.    stressed the importance of regular exercise  Injury prevention: Discussed safety belts, safety helmets, smoke detector, smoking near bedding or upholstery.   Dental health: Discussed importance of regular tooth brushing,  flossing, and dental visits.   Follow up plan: NEXT PREVENTATIVE PHYSICAL DUE IN 1 YEAR. No follow-ups on file.

## 2023-11-05 ENCOUNTER — Encounter: Payer: Self-pay | Admitting: Nurse Practitioner

## 2023-11-05 LAB — CBC WITH DIFFERENTIAL/PLATELET
Basophils Absolute: 0 10*3/uL (ref 0.0–0.2)
Basos: 0 %
EOS (ABSOLUTE): 0.1 10*3/uL (ref 0.0–0.4)
Eos: 2 %
Hematocrit: 41.3 % (ref 37.5–51.0)
Hemoglobin: 13.7 g/dL (ref 13.0–17.7)
Immature Grans (Abs): 0 10*3/uL (ref 0.0–0.1)
Immature Granulocytes: 0 %
Lymphocytes Absolute: 2.3 10*3/uL (ref 0.7–3.1)
Lymphs: 27 %
MCH: 29.8 pg (ref 26.6–33.0)
MCHC: 33.2 g/dL (ref 31.5–35.7)
MCV: 90 fL (ref 79–97)
Monocytes Absolute: 0.8 10*3/uL (ref 0.1–0.9)
Monocytes: 9 %
Neutrophils Absolute: 5.3 10*3/uL (ref 1.4–7.0)
Neutrophils: 62 %
Platelets: 269 10*3/uL (ref 150–450)
RBC: 4.59 x10E6/uL (ref 4.14–5.80)
RDW: 13.1 % (ref 11.6–15.4)
WBC: 8.6 10*3/uL (ref 3.4–10.8)

## 2023-11-05 LAB — COMPREHENSIVE METABOLIC PANEL WITH GFR
ALT: 37 IU/L (ref 0–44)
AST: 24 IU/L (ref 0–40)
Albumin: 4.8 g/dL (ref 4.1–5.1)
Alkaline Phosphatase: 98 IU/L (ref 44–121)
BUN/Creatinine Ratio: 11 (ref 9–20)
BUN: 12 mg/dL (ref 6–24)
Bilirubin Total: 0.4 mg/dL (ref 0.0–1.2)
CO2: 21 mmol/L (ref 20–29)
Calcium: 9.5 mg/dL (ref 8.7–10.2)
Chloride: 102 mmol/L (ref 96–106)
Creatinine, Ser: 1.14 mg/dL (ref 0.76–1.27)
Globulin, Total: 2.2 g/dL (ref 1.5–4.5)
Glucose: 97 mg/dL (ref 70–99)
Potassium: 4 mmol/L (ref 3.5–5.2)
Sodium: 140 mmol/L (ref 134–144)
Total Protein: 7 g/dL (ref 6.0–8.5)
eGFR: 78 mL/min/{1.73_m2} (ref >59)

## 2023-11-05 LAB — LIPID PANEL
Chol/HDL Ratio: 2.8 ratio (ref 0.0–5.0)
Cholesterol, Total: 102 mg/dL (ref 100–199)
HDL: 36 mg/dL — ABNORMAL LOW (ref >39)
LDL Chol Calc (NIH): 37 mg/dL (ref 0–99)
Triglycerides: 179 mg/dL — ABNORMAL HIGH (ref 0–149)
VLDL Cholesterol Cal: 29 mg/dL (ref 5–40)

## 2023-11-05 LAB — TSH: TSH: 1.6 u[IU]/mL (ref 0.450–4.500)

## 2023-11-05 LAB — PSA: Prostate Specific Ag, Serum: 1.5 ng/mL (ref 0.0–4.0)

## 2023-11-05 NOTE — Assessment & Plan Note (Signed)
 Chronic. Not well controlled.  Continue with Amlodipine .  Losartan  changed to Olmesartan .  Labs ordered today.  Continue to check blood pressure at home.  Follow up in 1 month.  Call sooner if concerns arise.

## 2023-11-05 NOTE — Assessment & Plan Note (Signed)
 Recommended eating smaller high protein, low fat meals more frequently and exercising 30 mins a day 5 times a week with a goal of 10-15lb weight loss in the next 3 months.

## 2023-11-05 NOTE — Assessment & Plan Note (Signed)
 Chronic.  Increase rosuvastatin  to 10mg .  Labs ordered today.  Follow up in 6 months.  Call sooner if concerns arise.

## 2023-11-05 NOTE — Assessment & Plan Note (Signed)
 Labs ordered at visit today.  Will make recommendations based on lab results.

## 2023-12-08 ENCOUNTER — Ambulatory Visit: Admitting: Nurse Practitioner

## 2023-12-08 ENCOUNTER — Encounter: Payer: Self-pay | Admitting: Nurse Practitioner

## 2023-12-08 VITALS — BP 132/72 | HR 84 | Temp 98.2°F | Resp 17 | Ht 74.29 in | Wt 382.4 lb

## 2023-12-08 DIAGNOSIS — I10 Essential (primary) hypertension: Secondary | ICD-10-CM

## 2023-12-08 MED ORDER — OLMESARTAN MEDOXOMIL 40 MG PO TABS: 40.0000 mg | ORAL_TABLET | Freq: Every day | ORAL | 1 refills | Status: DC

## 2023-12-08 NOTE — Progress Notes (Signed)
 BP 132/72 (BP Location: Left Arm, Patient Position: Sitting, Cuff Size: Large)   Pulse 84   Temp 98.2 F (36.8 C) (Oral)   Resp 17   Ht 6' 2.29" (1.887 m)   Wt (!) 382 lb 6.4 oz (173.5 kg)   SpO2 98%   BMI 48.71 kg/m    Subjective:    Patient ID: Alan Barnes, male    DOB: 02-Aug-1970, 53 y.o.   MRN: [id number]  HPI: Alan Barnes is a 53 y.o. male  Chief Complaint  Patient presents with   Hypertension    Feels good.    HYPERTENSION Hypertension status: controlled  Satisfied with current treatment? no Duration of hypertension: years BP monitoring frequency:  daily BP range: 120-140/80 BP medication side effects:  no Medication compliance: excellent compliance Previous BP meds:amlodipine  and losartan  (cozaar ) Aspirin: no Recurrent headaches: no Visual changes: no Palpitations: no Dyspnea: no Chest pain: no Lower extremity edema: no Dizzy/lightheaded: no   Relevant past medical, surgical, family and social history reviewed and updated as indicated. Interim medical history since our last visit reviewed. Allergies and medications reviewed and updated.  Review of Systems  Eyes:  Negative for visual disturbance.  Respiratory:  Negative for shortness of breath.   Cardiovascular:  Negative for chest pain and leg swelling.  Neurological:  Negative for light-headedness and headaches.    Per HPI unless specifically indicated above     Objective:     BP 132/72 (BP Location: Left Arm, Patient Position: Sitting, Cuff Size: Large)   Pulse 84   Temp 98.2 F (36.8 C) (Oral)   Resp 17   Ht 6' 2.29" (1.887 m)   Wt (!) 382 lb 6.4 oz (173.5 kg)   SpO2 98%   BMI 48.71 kg/m   Wt Readings from Last 3 Encounters:  12/08/23 (!) 382 lb 6.4 oz (173.5 kg)  11/04/23 (!) 385 lb 9.6 oz (174.9 kg)  12/24/22 (!) 373 lb 9.6 oz (169.5 kg)    Physical Exam Vitals and nursing note reviewed.  Constitutional:      General: He is not in acute distress.    Appearance: Normal  appearance. He is obese. He is not ill-appearing, toxic-appearing or diaphoretic.  HENT:     Head: Normocephalic.     Right Ear: External ear normal.     Left Ear: External ear normal.     Nose: Nose normal. No congestion or rhinorrhea.     Mouth/Throat:     Mouth: Mucous membranes are moist.  Eyes:     General:        Right eye: No discharge.        Left eye: No discharge.     Extraocular Movements: Extraocular movements intact.     Conjunctiva/sclera: Conjunctivae normal.     Pupils: Pupils are equal, round, and reactive to light.  Cardiovascular:     Rate and Rhythm: Normal rate and regular rhythm.     Heart sounds: No murmur heard. Pulmonary:     Effort: Pulmonary effort is normal. No respiratory distress.     Breath sounds: Normal breath sounds. No wheezing, rhonchi or rales.  Abdominal:     General: Abdomen is flat. Bowel sounds are normal.  Musculoskeletal:     Cervical back: Normal range of motion and neck supple.  Skin:    General: Skin is warm and dry.     Capillary Refill: Capillary refill takes less than 2 seconds.  Neurological:     General: No focal  deficit present.     Mental Status: He is alert and oriented to person, place, and time.  Psychiatric:        Mood and Affect: Mood normal.        Behavior: Behavior normal.        Thought Content: Thought content normal.        Judgment: Judgment normal.     Results for orders placed or performed in visit on 11/04/23  TSH   Collection Time: 11/04/23  4:16 PM  Result Value Ref Range   TSH 1.600 0.450 - 4.500 uIU/mL  PSA   Collection Time: 11/04/23  4:16 PM  Result Value Ref Range   Prostate Specific Ag, Serum 1.5 0.0 - 4.0 ng/mL  Lipid panel   Collection Time: 11/04/23  4:16 PM  Result Value Ref Range   Cholesterol, Total 102 100 - 199 mg/dL   Triglycerides 272 (H) 0 - 149 mg/dL   HDL 36 (L) >53 mg/dL   VLDL Cholesterol Cal 29 5 - 40 mg/dL   LDL Chol Calc (NIH) 37 0 - 99 mg/dL   Chol/HDL Ratio 2.8 0.0  - 5.0 ratio  CBC with Differential/Platelet   Collection Time: 11/04/23  4:16 PM  Result Value Ref Range   WBC 8.6 3.4 - 10.8 x10E3/uL   RBC 4.59 4.14 - 5.80 x10E6/uL   Hemoglobin 13.7 13.0 - 17.7 g/dL   Hematocrit 66.4 40.3 - 51.0 %   MCV 90 79 - 97 fL   MCH 29.8 26.6 - 33.0 pg   MCHC 33.2 31.5 - 35.7 g/dL   RDW 47.4 25.9 - 56.3 %   Platelets 269 150 - 450 x10E3/uL   Neutrophils 62 Not Estab. %   Lymphs 27 Not Estab. %   Monocytes 9 Not Estab. %   Eos 2 Not Estab. %   Basos 0 Not Estab. %   Neutrophils Absolute 5.3 1.4 - 7.0 x10E3/uL   Lymphocytes Absolute 2.3 0.7 - 3.1 x10E3/uL   Monocytes Absolute 0.8 0.1 - 0.9 x10E3/uL   EOS (ABSOLUTE) 0.1 0.0 - 0.4 x10E3/uL   Basophils Absolute 0.0 0.0 - 0.2 x10E3/uL   Immature Granulocytes 0 Not Estab. %   Immature Grans (Abs) 0.0 0.0 - 0.1 x10E3/uL  Comprehensive metabolic panel with GFR   Collection Time: 11/04/23  4:16 PM  Result Value Ref Range   Glucose 97 70 - 99 mg/dL   BUN 12 6 - 24 mg/dL   Creatinine, Ser 8.75 0.76 - 1.27 mg/dL   eGFR 78 >64 PP/IRJ/1.88   BUN/Creatinine Ratio 11 9 - 20   Sodium 140 134 - 144 mmol/L   Potassium 4.0 3.5 - 5.2 mmol/L   Chloride 102 96 - 106 mmol/L   CO2 21 20 - 29 mmol/L   Calcium  9.5 8.7 - 10.2 mg/dL   Total Protein 7.0 6.0 - 8.5 g/dL   Albumin 4.8 4.1 - 5.1 g/dL   Globulin, Total 2.2 1.5 - 4.5 g/dL   Bilirubin Total 0.4 0.0 - 1.2 mg/dL   Alkaline Phosphatase 98 44 - 121 IU/L   AST 24 0 - 40 IU/L   ALT 37 0 - 44 IU/L      Assessment & Plan:   Problem List Items Addressed This Visit       Cardiovascular and Mediastinum   Hypertension - Primary   Chronic.  Improving from last visit.  Was running 130-160s/80-90 now running 120-140/80s at home.  Continue with current regimen of Amlodipine  and Olmesartan .  If still elevated can add chlorthalidone at next visit.  Follow up in 3 months.  Call sooner if concerns arise.       Relevant Medications   olmesartan  (BENICAR ) 40 MG tablet      Follow up plan: Return in about 3 months (around 03/09/2024) for HTN, HLD, DM2 FU.

## 2023-12-08 NOTE — Assessment & Plan Note (Signed)
 Chronic.  Improving from last visit.  Was running 130-160s/80-90 now running 120-140/80s at home.  Continue with current regimen of Amlodipine  and Olmesartan .  If still elevated can add chlorthalidone at next visit.  Follow up in 3 months.  Call sooner if concerns arise.

## 2023-12-24 ENCOUNTER — Other Ambulatory Visit: Payer: Self-pay | Admitting: Nurse Practitioner

## 2023-12-24 NOTE — Telephone Encounter (Signed)
 Too soon for refill, refilled 12/08/23 for 90 and 1 RF.  Requested Prescriptions  Pending Prescriptions Disp Refills   olmesartan  (BENICAR ) 40 MG tablet [Pharmacy Med Name: Olmesartan  Medoxomil 40 MG Oral Tablet] 30 tablet 0    Sig: Take 1 tablet by mouth once daily     Cardiovascular:  Angiotensin Receptor Blockers Failed - 12/24/2023  2:12 PM      Failed - Valid encounter within last 6 months    Recent Outpatient Visits           2 weeks ago Primary hypertension   [place] Aileen Alexanders, NP   1 month ago Annual physical exam   [place] [facility] Aileen Alexanders, NP              Passed - Cr in normal range and within 180 days    Creatinine, Ser  Date Value Ref Range Status  11/04/2023 1.14 0.76 - 1.27 mg/dL Final         Passed - K in normal range and within 180 days    Potassium  Date Value Ref Range Status  11/04/2023 4.0 3.5 - 5.2 mmol/L Final         Passed - Patient is not pregnant      Passed - Last BP in normal range    BP Readings from Last 1 Encounters:  12/08/23 132/72

## 2024-03-09 ENCOUNTER — Encounter: Payer: Self-pay | Admitting: Nurse Practitioner

## 2024-03-09 ENCOUNTER — Other Ambulatory Visit (HOSPITAL_COMMUNITY): Payer: Self-pay

## 2024-03-09 ENCOUNTER — Ambulatory Visit: Admitting: Nurse Practitioner

## 2024-03-09 VITALS — BP 137/85 | HR 85 | Temp 98.1°F | Resp 15 | Ht 74.29 in | Wt 381.4 lb

## 2024-03-09 DIAGNOSIS — I10 Essential (primary) hypertension: Secondary | ICD-10-CM

## 2024-03-09 DIAGNOSIS — N1831 Chronic kidney disease, stage 3a: Secondary | ICD-10-CM

## 2024-03-09 DIAGNOSIS — E7800 Pure hypercholesterolemia, unspecified: Secondary | ICD-10-CM

## 2024-03-09 DIAGNOSIS — N200 Calculus of kidney: Secondary | ICD-10-CM

## 2024-03-09 LAB — URINALYSIS, ROUTINE W REFLEX MICROSCOPIC
Bilirubin, UA: NEGATIVE
Glucose, UA: NEGATIVE
Ketones, UA: NEGATIVE
Leukocytes,UA: NEGATIVE
Nitrite, UA: NEGATIVE
Protein,UA: NEGATIVE
Specific Gravity, UA: 1.015 (ref 1.005–1.030)
Urobilinogen, Ur: 1 mg/dL (ref 0.2–1.0)
pH, UA: 6 (ref 5.0–7.5)

## 2024-03-09 LAB — MICROSCOPIC EXAMINATION: Bacteria, UA: NONE SEEN

## 2024-03-09 MED ORDER — ROSUVASTATIN CALCIUM 10 MG PO TABS: 10.0000 mg | ORAL_TABLET | Freq: Every day | ORAL | 1 refills | Status: AC

## 2024-03-09 MED ORDER — TAMSULOSIN HCL 0.4 MG PO CAPS: 0.4000 mg | ORAL_CAPSULE | Freq: Every day | ORAL | 3 refills | Status: AC

## 2024-03-09 MED ORDER — OLMESARTAN MEDOXOMIL 40 MG PO TABS: 40.0000 mg | ORAL_TABLET | Freq: Every day | ORAL | 1 refills | Status: AC

## 2024-03-09 MED ORDER — AMLODIPINE BESYLATE 10 MG PO TABS: 10.0000 mg | ORAL_TABLET | Freq: Every day | ORAL | 1 refills | Status: AC

## 2024-03-09 NOTE — Assessment & Plan Note (Signed)
 Recommended eating smaller high protein, low fat meals more frequently and exercising 30 mins a day 5 times a week with a goal of 10-15lb weight loss in the next 3 months.

## 2024-03-09 NOTE — Assessment & Plan Note (Signed)
 Chronic.  Well controlled.  Need to keep blood pressure well controlled.  Would benefit from SGLT2 therapy.  Labs ordered. Follow up in 6 months.  Call sooner if concerns arise.

## 2024-03-09 NOTE — Assessment & Plan Note (Signed)
 UA ordered.  Would like to hold off on KUB.  Will send Flomax  during visit.  If he changes his mind about KUB will order at that time.  Follow up if not improved.

## 2024-03-09 NOTE — Progress Notes (Signed)
 BP 137/85 (BP Location: Left Arm, Patient Position: Sitting, Cuff Size: Large)   Pulse 85   Temp 98.1 F (36.7 C) (Oral)   Resp 15   Ht 6' 2.29 (1.887 m)   Wt (!) 381 lb 6.4 oz (173 kg)   SpO2 98%   BMI 48.59 kg/m    Subjective:    Patient ID: Alan Barnes, male    DOB: Jan 04, 1971, 53 y.o.   MRN: [id number]  HPI: Alan Barnes is a 53 y.o. male  Chief Complaint  Patient presents with   3 month follow up    Tracking on phone his phone ranging 103/70-140/90 taking at night.    Nephrolithiasis    Feels he has a kidney stone right now and wonders if he can get a refill on flomax .    HYPERTENSION Hypertension status: controlled  Satisfied with current treatment? no Duration of hypertension: years BP monitoring frequency:  daily BP range: 120-140/80 BP medication side effects:  no Medication compliance: excellent compliance Previous BP meds:amlodipine  and olmesartan  Aspirin: no Recurrent headaches: no Visual changes: no Palpitations: no Dyspnea: no Chest pain: no Lower extremity edema: no Dizzy/lightheaded: no  Patient states he feels like he has a kidney stone coming on that started about a week ago.  He has had them 3x before.  He felt like he pulled a muscle in his pack, cold sweats, and light headed. States he currently feels like there is a fist in his back.  Hasn't had to take any ibuprofen  or tylenol .   Relevant past medical, surgical, family and social history reviewed and updated as indicated. Interim medical history since our last visit reviewed. Allergies and medications reviewed and updated.  Review of Systems  Eyes:  Negative for visual disturbance.  Respiratory:  Negative for shortness of breath.   Cardiovascular:  Negative for chest pain and leg swelling.  Musculoskeletal:  Positive for back pain.  Neurological:  Negative for light-headedness and headaches.    Per HPI unless specifically indicated above     Objective:     BP 137/85 (BP  Location: Left Arm, Patient Position: Sitting, Cuff Size: Large)   Pulse 85   Temp 98.1 F (36.7 C) (Oral)   Resp 15   Ht 6' 2.29 (1.887 m)   Wt (!) 381 lb 6.4 oz (173 kg)   SpO2 98%   BMI 48.59 kg/m   Wt Readings from Last 3 Encounters:  03/09/24 (!) 381 lb 6.4 oz (173 kg)  12/08/23 (!) 382 lb 6.4 oz (173.5 kg)  11/04/23 (!) 385 lb 9.6 oz (174.9 kg)    Physical Exam Vitals and nursing note reviewed.  Constitutional:      General: He is not in acute distress.    Appearance: Normal appearance. He is obese. He is not ill-appearing, toxic-appearing or diaphoretic.  HENT:     Head: Normocephalic.     Right Ear: External ear normal.     Left Ear: External ear normal.     Nose: Nose normal. No congestion or rhinorrhea.     Mouth/Throat:     Mouth: Mucous membranes are moist.  Eyes:     General:        Right eye: No discharge.        Left eye: No discharge.     Extraocular Movements: Extraocular movements intact.     Conjunctiva/sclera: Conjunctivae normal.     Pupils: Pupils are equal, round, and reactive to light.  Cardiovascular:     Rate  and Rhythm: Normal rate and regular rhythm.     Heart sounds: No murmur heard. Pulmonary:     Effort: Pulmonary effort is normal. No respiratory distress.     Breath sounds: Normal breath sounds. No wheezing, rhonchi or rales.  Abdominal:     General: Abdomen is flat. Bowel sounds are normal. There is no distension.     Palpations: There is no mass.     Tenderness: There is no abdominal tenderness. There is no right CVA tenderness, left CVA tenderness, guarding or rebound.     Hernia: No hernia is present.  Musculoskeletal:     Cervical back: Normal range of motion and neck supple.  Skin:    General: Skin is warm and dry.     Capillary Refill: Capillary refill takes less than 2 seconds.  Neurological:     General: No focal deficit present.     Mental Status: He is alert and oriented to person, place, and time.  Psychiatric:         Mood and Affect: Mood normal.        Behavior: Behavior normal.        Thought Content: Thought content normal.        Judgment: Judgment normal.     Results for orders placed or performed in visit on 11/04/23  TSH   Collection Time: 11/04/23  4:16 PM  Result Value Ref Range   TSH 1.600 0.450 - 4.500 uIU/mL  PSA   Collection Time: 11/04/23  4:16 PM  Result Value Ref Range   Prostate Specific Ag, Serum 1.5 0.0 - 4.0 ng/mL  Lipid panel   Collection Time: 11/04/23  4:16 PM  Result Value Ref Range   Cholesterol, Total 102 100 - 199 mg/dL   Triglycerides 820 (H) 0 - 149 mg/dL   HDL 36 (L) >60 mg/dL   VLDL Cholesterol Cal 29 5 - 40 mg/dL   LDL Chol Calc (NIH) 37 0 - 99 mg/dL   Chol/HDL Ratio 2.8 0.0 - 5.0 ratio  CBC with Differential/Platelet   Collection Time: 11/04/23  4:16 PM  Result Value Ref Range   WBC 8.6 3.4 - 10.8 x10E3/uL   RBC 4.59 4.14 - 5.80 x10E6/uL   Hemoglobin 13.7 13.0 - 17.7 g/dL   Hematocrit 58.6 62.4 - 51.0 %   MCV 90 79 - 97 fL   MCH 29.8 26.6 - 33.0 pg   MCHC 33.2 31.5 - 35.7 g/dL   RDW 86.8 88.3 - 84.5 %   Platelets 269 150 - 450 x10E3/uL   Neutrophils 62 Not Estab. %   Lymphs 27 Not Estab. %   Monocytes 9 Not Estab. %   Eos 2 Not Estab. %   Basos 0 Not Estab. %   Neutrophils Absolute 5.3 1.4 - 7.0 x10E3/uL   Lymphocytes Absolute 2.3 0.7 - 3.1 x10E3/uL   Monocytes Absolute 0.8 0.1 - 0.9 x10E3/uL   EOS (ABSOLUTE) 0.1 0.0 - 0.4 x10E3/uL   Basophils Absolute 0.0 0.0 - 0.2 x10E3/uL   Immature Granulocytes 0 Not Estab. %   Immature Grans (Abs) 0.0 0.0 - 0.1 x10E3/uL  Comprehensive metabolic panel with GFR   Collection Time: 11/04/23  4:16 PM  Result Value Ref Range   Glucose 97 70 - 99 mg/dL   BUN 12 6 - 24 mg/dL   Creatinine, Ser 8.85 0.76 - 1.27 mg/dL   eGFR 78 >40 fO/fpw/8.26   BUN/Creatinine Ratio 11 9 - 20   Sodium 140 134 - 144  mmol/L   Potassium 4.0 3.5 - 5.2 mmol/L   Chloride 102 96 - 106 mmol/L   CO2 21 20 - 29 mmol/L   Calcium  9.5 8.7  - 10.2 mg/dL   Total Protein 7.0 6.0 - 8.5 g/dL   Albumin 4.8 4.1 - 5.1 g/dL   Globulin, Total 2.2 1.5 - 4.5 g/dL   Bilirubin Total 0.4 0.0 - 1.2 mg/dL   Alkaline Phosphatase 98 44 - 121 IU/L   AST 24 0 - 40 IU/L   ALT 37 0 - 44 IU/L      Assessment & Plan:   Problem List Items Addressed This Visit       Cardiovascular and Mediastinum   Hypertension   Chronic.  Controlled.   Continue with current regimen of Amlodipine  and Olmesartan .  Refills sent today.  If elevated at future visits can add chlorthalidone at next visit.  Follow up in 6 months.  Call sooner if concerns arise.       Relevant Orders   Comp Met (CMET)     Genitourinary   Nephrolithiasis   UA ordered.  Would like to hold off on KUB.  Will send Flomax  during visit.  If he changes his mind about KUB will order at that time.  Follow up if not improved.       Relevant Orders   Urinalysis, Routine w reflex microscopic   Stage 3a chronic kidney disease (HCC) - Primary   Chronic.  Well controlled.  Need to keep blood pressure well controlled.  Would benefit from SGLT2 therapy.  Labs ordered. Follow up in 6 months.  Call sooner if concerns arise.         Other   Morbid obesity (HCC)   Recommended eating smaller high protein, low fat meals more frequently and exercising 30 mins a day 5 times a week with a goal of 10-15lb weight loss in the next 3 months.       Hypercholesteremia   Chronic.  Continue with rosuvastatin  20mg  daily.   Labs ordered today.  Follow up in 6 months.  Call sooner if concerns arise.       Relevant Orders   Lipid Profile     Follow up plan: Return in about 6 months (around 09/09/2024) for HTN, HLD, DM2 FU.

## 2024-03-09 NOTE — Assessment & Plan Note (Signed)
 Chronic.  Controlled.   Continue with current regimen of Amlodipine  and Olmesartan .  Refills sent today.  If elevated at future visits can add chlorthalidone at next visit.  Follow up in 6 months.  Call sooner if concerns arise.

## 2024-03-09 NOTE — Assessment & Plan Note (Signed)
 Chronic.  Continue with rosuvastatin  20mg  daily.   Labs ordered today.  Follow up in 6 months.  Call sooner if concerns arise.

## 2024-03-10 ENCOUNTER — Ambulatory Visit: Payer: Self-pay | Admitting: Nurse Practitioner

## 2024-03-10 LAB — LIPID PANEL
Chol/HDL Ratio: 2.6 ratio (ref 0.0–5.0)
Cholesterol, Total: 90 mg/dL — ABNORMAL LOW (ref 100–199)
HDL: 35 mg/dL — ABNORMAL LOW (ref >39)
LDL Chol Calc (NIH): 32 mg/dL (ref 0–99)
Triglycerides: 126 mg/dL (ref 0–149)
VLDL Cholesterol Cal: 23 mg/dL (ref 5–40)

## 2024-03-10 LAB — COMPREHENSIVE METABOLIC PANEL WITH GFR
ALT: 26 IU/L (ref 0–44)
AST: 19 IU/L (ref 0–40)
Albumin: 4.7 g/dL (ref 3.8–4.9)
Alkaline Phosphatase: 81 IU/L (ref 44–121)
BUN/Creatinine Ratio: 11 (ref 9–20)
BUN: 15 mg/dL (ref 6–24)
Bilirubin Total: 0.4 mg/dL (ref 0.0–1.2)
CO2: 22 mmol/L (ref 20–29)
Calcium: 9.8 mg/dL (ref 8.7–10.2)
Chloride: 103 mmol/L (ref 96–106)
Creatinine, Ser: 1.4 mg/dL — ABNORMAL HIGH (ref 0.76–1.27)
Globulin, Total: 2.5 g/dL (ref 1.5–4.5)
Glucose: 93 mg/dL (ref 70–99)
Potassium: 4.6 mmol/L (ref 3.5–5.2)
Sodium: 140 mmol/L (ref 134–144)
Total Protein: 7.2 g/dL (ref 6.0–8.5)
eGFR: 61 mL/min/1.73 (ref >59)

## 2024-09-12 ENCOUNTER — Ambulatory Visit: Admitting: Nurse Practitioner
